# Patient Record
Sex: Female | Born: 1950 | Race: White | Hispanic: No | Marital: Single | Smoking: Never smoker
Health system: Southern US, Community
[De-identification: ages and names within clinical notes are randomized; demographics above are authoritative.]

## PROBLEM LIST (undated history)

## (undated) DIAGNOSIS — K219 Gastro-esophageal reflux disease without esophagitis: Secondary | ICD-10-CM

## (undated) DIAGNOSIS — M1990 Unspecified osteoarthritis, unspecified site: Secondary | ICD-10-CM

## (undated) DIAGNOSIS — IMO0001 Reserved for inherently not codable concepts without codable children: Secondary | ICD-10-CM

## (undated) DIAGNOSIS — R32 Unspecified urinary incontinence: Secondary | ICD-10-CM

## (undated) DIAGNOSIS — R05 Cough: Secondary | ICD-10-CM

## (undated) DIAGNOSIS — J329 Chronic sinusitis, unspecified: Secondary | ICD-10-CM

## (undated) DIAGNOSIS — E119 Type 2 diabetes mellitus without complications: Secondary | ICD-10-CM

## (undated) DIAGNOSIS — R059 Cough, unspecified: Secondary | ICD-10-CM

## (undated) DIAGNOSIS — E1140 Type 2 diabetes mellitus with diabetic neuropathy, unspecified: Secondary | ICD-10-CM

## (undated) DIAGNOSIS — M25669 Stiffness of unspecified knee, not elsewhere classified: Secondary | ICD-10-CM

## (undated) DIAGNOSIS — B019 Varicella without complication: Secondary | ICD-10-CM

## (undated) HISTORY — DX: Gastro-esophageal reflux disease without esophagitis: K21.9

## (undated) HISTORY — DX: Varicella without complication: B01.9

## (undated) HISTORY — PX: TUBAL LIGATION: SHX77

## (undated) HISTORY — PX: EYE SURGERY: SHX253

## (undated) HISTORY — DX: Unspecified urinary incontinence: R32

## (undated) HISTORY — DX: Unspecified osteoarthritis, unspecified site: M19.90

## (undated) HISTORY — DX: Chronic sinusitis, unspecified: J32.9

---

## 1973-07-23 HISTORY — PX: TUBAL LIGATION: SHX77

## 1973-07-23 HISTORY — PX: TONSILLECTOMY AND ADENOIDECTOMY: SHX28

## 1980-07-23 HISTORY — PX: OTHER SURGICAL HISTORY: SHX169

## 1999-07-06 ENCOUNTER — Encounter: Payer: Self-pay | Admitting: Obstetrics and Gynecology

## 1999-07-06 ENCOUNTER — Encounter: Admission: RE | Admit: 1999-07-06 | Discharge: 1999-07-06 | Payer: Self-pay | Admitting: Obstetrics and Gynecology

## 1999-07-24 HISTORY — PX: TOTAL ABDOMINAL HYSTERECTOMY W/ BILATERAL SALPINGOOPHORECTOMY: SHX83

## 2001-02-12 ENCOUNTER — Other Ambulatory Visit: Admission: RE | Admit: 2001-02-12 | Discharge: 2001-02-12 | Payer: Self-pay | Admitting: Obstetrics and Gynecology

## 2001-03-07 ENCOUNTER — Other Ambulatory Visit: Admission: RE | Admit: 2001-03-07 | Discharge: 2001-03-07 | Payer: Self-pay | Admitting: Obstetrics and Gynecology

## 2001-03-17 ENCOUNTER — Encounter: Payer: Self-pay | Admitting: Obstetrics and Gynecology

## 2001-03-17 ENCOUNTER — Encounter: Admission: RE | Admit: 2001-03-17 | Discharge: 2001-03-17 | Payer: Self-pay | Admitting: Obstetrics and Gynecology

## 2001-03-28 ENCOUNTER — Inpatient Hospital Stay (HOSPITAL_COMMUNITY): Admission: RE | Admit: 2001-03-28 | Discharge: 2001-03-31 | Payer: Self-pay | Admitting: Obstetrics and Gynecology

## 2005-04-27 ENCOUNTER — Ambulatory Visit: Payer: Self-pay

## 2005-08-31 ENCOUNTER — Ambulatory Visit: Payer: Self-pay

## 2008-07-23 HISTORY — PX: CARDIAC CATHETERIZATION: SHX172

## 2008-10-25 ENCOUNTER — Emergency Department: Payer: Self-pay | Admitting: Internal Medicine

## 2009-08-15 ENCOUNTER — Emergency Department: Payer: Self-pay | Admitting: Emergency Medicine

## 2009-12-13 ENCOUNTER — Ambulatory Visit: Payer: Self-pay | Admitting: Cardiology

## 2010-07-23 HISTORY — PX: OTHER SURGICAL HISTORY: SHX169

## 2010-08-31 ENCOUNTER — Ambulatory Visit: Payer: Self-pay | Admitting: Internal Medicine

## 2010-12-20 ENCOUNTER — Ambulatory Visit: Payer: Self-pay

## 2011-06-14 ENCOUNTER — Ambulatory Visit: Payer: Self-pay

## 2013-05-26 ENCOUNTER — Ambulatory Visit: Payer: Self-pay

## 2013-06-04 ENCOUNTER — Ambulatory Visit (INDEPENDENT_AMBULATORY_CARE_PROVIDER_SITE_OTHER): Payer: Federal, State, Local not specified - PPO | Admitting: Adult Health

## 2013-06-04 ENCOUNTER — Encounter: Payer: Self-pay | Admitting: Adult Health

## 2013-06-04 VITALS — BP 126/84 | HR 87 | Ht 62.0 in | Wt 204.0 lb

## 2013-06-04 DIAGNOSIS — Z0000 Encounter for general adult medical examination without abnormal findings: Secondary | ICD-10-CM

## 2013-06-04 DIAGNOSIS — F4321 Adjustment disorder with depressed mood: Secondary | ICD-10-CM

## 2013-06-04 DIAGNOSIS — R5381 Other malaise: Secondary | ICD-10-CM

## 2013-06-04 DIAGNOSIS — IMO0001 Reserved for inherently not codable concepts without codable children: Secondary | ICD-10-CM

## 2013-06-04 DIAGNOSIS — J329 Chronic sinusitis, unspecified: Secondary | ICD-10-CM

## 2013-06-04 DIAGNOSIS — M797 Fibromyalgia: Secondary | ICD-10-CM

## 2013-06-04 DIAGNOSIS — F329 Major depressive disorder, single episode, unspecified: Secondary | ICD-10-CM

## 2013-06-04 DIAGNOSIS — R252 Cramp and spasm: Secondary | ICD-10-CM

## 2013-06-04 DIAGNOSIS — R5383 Other fatigue: Secondary | ICD-10-CM

## 2013-06-04 DIAGNOSIS — G2581 Restless legs syndrome: Secondary | ICD-10-CM | POA: Insufficient documentation

## 2013-06-04 DIAGNOSIS — Z1239 Encounter for other screening for malignant neoplasm of breast: Secondary | ICD-10-CM

## 2013-06-04 MED ORDER — PRAMIPEXOLE DIHYDROCHLORIDE 0.5 MG PO TABS: 0.5000 mg | ORAL_TABLET | Freq: Two times a day (BID) | ORAL | Status: DC

## 2013-06-04 MED ORDER — FLUTICASONE PROPIONATE 50 MCG/ACT NA SUSP: 2.0000 | Freq: Every day | NASAL | Status: DC

## 2013-06-04 NOTE — Assessment & Plan Note (Signed)
Normal physical exam. Check labs: CBC with differential, basic metabolic panel, hepatic panel, TSH, vitamin D, B12, lipid. Mammogram ordered. Patient will schedule at [facility]. She reports colonoscopy approximately 10 years ago. Reports adhesions from hysterectomy present scoping. Request medical records from previous PCP and from Dr. Mechele Collin

## 2013-06-04 NOTE — Assessment & Plan Note (Signed)
Patient has overall pain, fatigue. She is requesting referral for fibromyalgia screen. Referral to rheumatology in [place].

## 2013-06-04 NOTE — Assessment & Plan Note (Signed)
Check electrolytes including magnesium. Try yellow mustard 1 teaspoon at bedtime.

## 2013-06-04 NOTE — Assessment & Plan Note (Signed)
Patient's Denis brother died at age 63 from leukemia approximately 5 years ago. Patient reports she has had a very difficult time with his death. She is still very tearful when discussing the events.? Depression. Previous PCP recommended antidepressant. Patient does not feel that she needs an antidepressant. Continue to follow. May benefit from group counseling or some form of grief counseling to help her sort out some of her unresolved grief

## 2013-06-04 NOTE — Progress Notes (Signed)
  Subjective:    Patient ID: Alexandria Silva, female    DOB: 01/13/1951, 62 y.o.   MRN: [id number]  HPI  Patient is a 62 y/o female who presents to clinic to establish care. Previous PCP was Dr. Randa Lynn.     Review of Systems     Objective:   Physical Exam        Assessment & Plan:

## 2013-06-04 NOTE — Assessment & Plan Note (Signed)
Reports usually 2 episodes a year minimum. Episodes usually flareup in the spring and fall. She is not currently on any antihistamine or nasal spray. Start Flonase 2 sprays into each nostril daily. Recommend humidifier and saline spray for irrigation.

## 2013-06-04 NOTE — Patient Instructions (Signed)
   Thank you for choosing [facility] for your health care needs.  Please have your labs drawn at your earliest convenience.  The results will be available through MyChart for your convenience. Please remember to activate this. The activation code is located at the end of this form.  Start flonase nasal spray 2 sprays into each nostril daily  Referral to Rheumatology for Fibromyalgia evaluation. They will call you with an appointment.

## 2013-06-09 ENCOUNTER — Other Ambulatory Visit (INDEPENDENT_AMBULATORY_CARE_PROVIDER_SITE_OTHER): Payer: Federal, State, Local not specified - PPO

## 2013-06-09 DIAGNOSIS — R5381 Other malaise: Secondary | ICD-10-CM

## 2013-06-09 DIAGNOSIS — Z0000 Encounter for general adult medical examination without abnormal findings: Secondary | ICD-10-CM

## 2013-06-09 DIAGNOSIS — R5383 Other fatigue: Secondary | ICD-10-CM

## 2013-06-09 DIAGNOSIS — R252 Cramp and spasm: Secondary | ICD-10-CM

## 2013-06-09 LAB — MAGNESIUM: Magnesium: 1.9 mg/dL (ref 1.5–2.5)

## 2013-06-09 LAB — CBC WITH DIFFERENTIAL/PLATELET
Basophils Absolute: 0 10*3/uL (ref 0.0–0.1)
Eosinophils Absolute: 0 10*3/uL (ref 0.0–0.7)
Eosinophils Relative: 0.9 % (ref 0.0–5.0)
HCT: 38.2 % (ref 36.0–46.0)
Hemoglobin: 13.1 g/dL (ref 12.0–15.0)
Lymphs Abs: 1.9 10*3/uL (ref 0.7–4.0)
MCHC: 34.2 g/dL (ref 30.0–36.0)
Monocytes Absolute: 0.4 10*3/uL (ref 0.1–1.0)
Neutro Abs: 2.6 10*3/uL (ref 1.4–7.7)
Neutrophils Relative %: 52.4 % (ref 43.0–77.0)
RDW: 14.1 % (ref 11.5–14.6)

## 2013-06-09 LAB — HEPATIC FUNCTION PANEL
ALT: 35 U/L (ref 0–35)
AST: 21 U/L (ref 0–37)
Alkaline Phosphatase: 76 U/L (ref 39–117)
Bilirubin, Direct: 0.2 mg/dL (ref 0.0–0.3)
Total Bilirubin: 1 mg/dL (ref 0.3–1.2)
Total Protein: 6.7 g/dL (ref 6.0–8.3)

## 2013-06-09 LAB — LIPID PANEL: Total CHOL/HDL Ratio: 3

## 2013-06-09 LAB — BASIC METABOLIC PANEL
BUN: 14 mg/dL (ref 6–23)
Creatinine, Ser: 0.6 mg/dL (ref 0.4–1.2)
GFR: 99.35 mL/min (ref >60.00)
Sodium: 139 mEq/L (ref 135–145)

## 2013-06-09 LAB — LDL CHOLESTEROL, DIRECT: Direct LDL: 132.4 mg/dL

## 2013-06-10 ENCOUNTER — Encounter: Payer: Self-pay | Admitting: Emergency Medicine

## 2013-06-12 ENCOUNTER — Encounter: Payer: Self-pay | Admitting: *Deleted

## 2013-06-15 ENCOUNTER — Encounter: Payer: Self-pay | Admitting: Emergency Medicine

## 2013-07-23 HISTORY — PX: KNEE SURGERY: SHX244

## 2013-10-02 ENCOUNTER — Ambulatory Visit: Payer: Self-pay | Admitting: Orthopedic Surgery

## 2014-01-09 ENCOUNTER — Emergency Department: Payer: Self-pay | Admitting: Emergency Medicine

## 2014-03-25 ENCOUNTER — Other Ambulatory Visit: Payer: Self-pay | Admitting: Adult Health

## 2014-03-26 ENCOUNTER — Other Ambulatory Visit: Payer: Self-pay | Admitting: Adult Health

## 2014-03-31 ENCOUNTER — Ambulatory Visit: Payer: Self-pay | Admitting: General Practice

## 2014-05-03 ENCOUNTER — Telehealth: Payer: Self-pay

## 2014-05-03 ENCOUNTER — Other Ambulatory Visit: Payer: Self-pay | Admitting: *Deleted

## 2014-05-03 MED ORDER — PRAMIPEXOLE DIHYDROCHLORIDE 0.5 MG PO TABS: ORAL_TABLET | ORAL | Status: DC

## 2014-05-03 NOTE — Telephone Encounter (Signed)
The patient called and is hoping to get a refill of her Mirapex rx.  (pt's last ov is 06/04/13)   Pharmacy - [facility] on [address]  Pt callback [phone]

## 2014-05-03 NOTE — Telephone Encounter (Signed)
Scheduled appoint

## 2014-05-07 ENCOUNTER — Encounter: Payer: Self-pay | Admitting: General Practice

## 2014-05-23 ENCOUNTER — Encounter: Payer: Self-pay | Admitting: General Practice

## 2014-06-07 ENCOUNTER — Encounter (INDEPENDENT_AMBULATORY_CARE_PROVIDER_SITE_OTHER): Payer: Self-pay

## 2014-06-07 ENCOUNTER — Ambulatory Visit (INDEPENDENT_AMBULATORY_CARE_PROVIDER_SITE_OTHER): Payer: Federal, State, Local not specified - PPO | Admitting: Internal Medicine

## 2014-06-07 VITALS — BP 134/78 | HR 94 | Temp 99.0°F | Resp 16 | Ht 62.0 in | Wt 206.5 lb

## 2014-06-07 DIAGNOSIS — IMO0002 Reserved for concepts with insufficient information to code with codable children: Secondary | ICD-10-CM

## 2014-06-07 DIAGNOSIS — G2581 Restless legs syndrome: Secondary | ICD-10-CM

## 2014-06-07 DIAGNOSIS — E1165 Type 2 diabetes mellitus with hyperglycemia: Secondary | ICD-10-CM

## 2014-06-07 DIAGNOSIS — R7301 Impaired fasting glucose: Secondary | ICD-10-CM

## 2014-06-07 DIAGNOSIS — M25561 Pain in right knee: Secondary | ICD-10-CM

## 2014-06-07 DIAGNOSIS — E669 Obesity, unspecified: Secondary | ICD-10-CM

## 2014-06-07 DIAGNOSIS — E559 Vitamin D deficiency, unspecified: Secondary | ICD-10-CM

## 2014-06-07 DIAGNOSIS — Z23 Encounter for immunization: Secondary | ICD-10-CM

## 2014-06-07 DIAGNOSIS — Z1239 Encounter for other screening for malignant neoplasm of breast: Secondary | ICD-10-CM

## 2014-06-07 MED ORDER — PRAMIPEXOLE DIHYDROCHLORIDE 0.5 MG PO TABS: ORAL_TABLET | ORAL | Status: AC

## 2014-06-07 NOTE — Progress Notes (Signed)
Pre-visit discussion using our clinic review tool. No additional management support is needed unless otherwise documented below in the visit note.  

## 2014-06-07 NOTE — Progress Notes (Signed)
Patient ID: Alexandria Silva, female   DOB: 05/15/1951, 63 y.o.   MRN: [id number]   Patient Active Problem List   Diagnosis Date Noted  . Vitamin D deficiency 06/08/2014  . Knee pain, right 06/08/2014  . Diabetes mellitus type 2, uncontrolled 06/08/2014  . Sinusitis, chronic 06/04/2013  . Unresolved grief 06/04/2013  . Routine general medical examination at a health care facility 06/04/2013  . Restless legs syndrome 06/04/2013  . Fibromyalgia 06/04/2013    Subjective:  CC:   Chief Complaint  Patient presents with  . Follow-up    For medication refills    HPI:   Alexandria Silva is a 63 y.o. female who presents for   Past Medical History  Diagnosis Date  . Arthritis     Bilateral knees  . GERD (gastroesophageal reflux disease)   . Chicken pox   . Chronic sinus infection   . Urine incontinence     Past Surgical History  Procedure Laterality Date  . Total abdominal hysterectomy w/ bilateral salpingoophorectomy    . Tonsillectomy and adenoidectomy    . Rupture disc  1982    C4  . Tubal ligation    . 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 Cardiac catheterization  2010    Dr Cassie FreerParachos        The following portions of the patient's history were reviewed and updated as appropriate: Allergies, current medications, and problem list.    Review of Systems:   Patient denies headache, fevers, malaise, unintentional weight loss, skin rash, eye pain, sinus congestion and sinus pain, sore throat, dysphagia,  hemoptysis , cough, dyspnea, wheezing, chest pain, palpitations, orthopnea, edema, abdominal pain, nausea, melena, diarrhea, constipation, flank pain, dysuria, hematuria, urinary  Frequency, nocturia, numbness, tingling, seizures,  Focal weakness, Loss of consciousness,  Tremor, insomnia, depression, anxiety, and suicidal ideation.     History   Social History  . Marital Status: Single    Spouse Name: N/A    Number of Children: 1  . Years of Education: 14    Occupational History  . Audiological scientistostmaster     [employer]   Social History Main Topics  . Smoking status: Never Smoker   . Smokeless tobacco: Not on file  . Alcohol Use: No  . Drug Use: No  . Sexual Activity: Not on file   Other Topics Concern  . Not on file   Social History Narrative   [place] 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 She has been with the post office for 30 years. She currently lives with a friend until she is able to sell her house in [place], [place]. She had been relocated there for her job but [place] in [place]. 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    Objective:  Filed Vitals:   06/07/14 1559  BP: 134/78  Pulse: 94  Temp: 99 F (37.2 C)  Resp: 16     General appearance: alert, cooperative and appears stated age Ears: normal TM's and external ear canals both ears Throat: lips, mucosa, and tongue normal; teeth and gums normal Neck: no adenopathy, no carotid bruit, supple, symmetrical, trachea midline and thyroid not enlarged, symmetric, no tenderness/mass/nodules Back: symmetric, no curvature. ROM normal. No CVA tenderness. Lungs: clear to auscultation bilaterally Heart: regular rate and rhythm, S1, S2 normal, no murmur, click, rub or gallop  Abdomen: soft, non-tender; bowel sounds normal; no masses,  no organomegaly Pulses: 2+ and symmetric Skin: Skin color, texture, turgor normal. No rashes or lesions Lymph nodes: Cervical, supraclavicular, and axillary nodes normal.  Assessment and Plan:  Restless legs syndrome managed with mirapex.Marland Kitchen.  Refills given  Vitamin D deficiency Patient cautioned to avoid mega doses of Vit d unless her level is lw.  Discussed the current controversies surrounding the risks and benefits of calcium supplementation.  Encouraged her to increase dietary calcium through natural foods including  almond/coconut milk  Knee pain, right She had had incomplete relief of pain with arthroscopy  Diabetes mellitus type 2, uncontrolled No prior diagnosis a,though she had a fasting glucose of 148 last year.  Will return for diabetes education and medication initiation with metformin.    Updated Medication List Outpatient Encounter Prescriptions as of 06/07/2014  Medication Sig  . fish oil-omega-3 fatty acids 1000 MG capsule Take 2 g by mouth daily.  . Multiple Vitamin (MULTIVITAMIN) tablet Take 1 tablet by mouth daily.  . pramipexole (MIRAPEX) 0.5 MG tablet TAKE 1 TABLET BY MOUTH TWICE DAILY  . [DISCONTINUED] pramipexole (MIRAPEX) 0.5 MG tablet TAKE 1 TABLET BY MOUTH TWICE DAILY  . ergocalciferol (DRISDOL) 50000 UNITS capsule Take 1 capsule (50,000 Units total) by mouth once a week.  . fluticasone (FLONASE) 50 MCG/ACT nasal spray Place 2 sprays into both nostrils daily.  Marland Kitchen. glucosamine-chondroitin 500-400 MG tablet Take 1 tablet by mouth 2 (two) times daily.     Orders Placed This Encounter  Procedures  . MM DIGITAL SCREENING BILATERAL  . Tdap vaccine greater than or equal to 7yo IM  . Comprehensive metabolic panel  . Vit D  25 hydroxy (rtn osteoporosis monitoring)  . TSH  . Hemoglobin A1c    No Follow-up on file.

## 2014-06-07 NOTE — Patient Instructions (Signed)
We are checking your vitamin D, thyroid  And A1c today  You received the TDao vaccine  I have ordered your mammogram today so you may set it  [facility]

## 2014-06-08 ENCOUNTER — Encounter: Payer: Self-pay | Admitting: Internal Medicine

## 2014-06-08 DIAGNOSIS — E1165 Type 2 diabetes mellitus with hyperglycemia: Secondary | ICD-10-CM | POA: Insufficient documentation

## 2014-06-08 DIAGNOSIS — E559 Vitamin D deficiency, unspecified: Secondary | ICD-10-CM | POA: Insufficient documentation

## 2014-06-08 DIAGNOSIS — M25561 Pain in right knee: Secondary | ICD-10-CM | POA: Insufficient documentation

## 2014-06-08 DIAGNOSIS — IMO0002 Reserved for concepts with insufficient information to code with codable children: Secondary | ICD-10-CM | POA: Insufficient documentation

## 2014-06-08 LAB — COMPREHENSIVE METABOLIC PANEL
ALK PHOS: 110 U/L (ref 39–117)
ALT: 28 U/L (ref 0–35)
AST: 23 U/L (ref 0–37)
Albumin: 4.6 g/dL (ref 3.5–5.2)
BUN: 13 mg/dL (ref 6–23)
CO2: 20 mEq/L (ref 19–32)
CREATININE: 1 mg/dL (ref 0.4–1.2)
Calcium: 9.6 mg/dL (ref 8.4–10.5)
Chloride: 113 mEq/L — ABNORMAL HIGH (ref 96–112)
GFR: 62.79 mL/min (ref >60.00)
Glucose, Bld: 263 mg/dL — ABNORMAL HIGH (ref 70–99)
Potassium: 5 mEq/L (ref 3.5–5.1)
Sodium: 146 mEq/L — ABNORMAL HIGH (ref 135–145)
Total Bilirubin: 0.5 mg/dL (ref 0.2–1.2)
Total Protein: 7.6 g/dL (ref 6.0–8.3)

## 2014-06-08 LAB — TSH: TSH: 1.13 u[IU]/mL (ref 0.35–4.50)

## 2014-06-08 LAB — VITAMIN D 25 HYDROXY (VIT D DEFICIENCY, FRACTURES): VITD: 19.04 ng/mL — ABNORMAL LOW (ref 30.00–100.00)

## 2014-06-08 LAB — HEMOGLOBIN A1C: HEMOGLOBIN A1C: 8.4 % — AB (ref 4.6–6.5)

## 2014-06-08 MED ORDER — ERGOCALCIFEROL 1.25 MG (50000 UT) PO CAPS: 50000.0000 [IU] | ORAL_CAPSULE | ORAL | Status: DC

## 2014-06-08 NOTE — Addendum Note (Signed)
Addended by: Sherlene ShamsULLO, Christopherjohn Schiele L on: 06/08/2014 10:30 PM   Modules accepted: Orders

## 2014-06-08 NOTE — Assessment & Plan Note (Signed)
Patient cautioned to avoid mega doses of Vit d unless her level is lw.  Discussed the current controversies surrounding the risks and benefits of calcium supplementation.  Encouraged her to increase dietary calcium through natural foods including almond/coconut milk

## 2014-06-08 NOTE — Assessment & Plan Note (Signed)
No prior diagnosis a,though she had a fasting glucose of 148 last year.  Will return for diabetes education and medication initiation with metformin.

## 2014-06-08 NOTE — Assessment & Plan Note (Signed)
managed with mirapex.Marland Kitchen.  Refills given

## 2014-06-08 NOTE — Assessment & Plan Note (Signed)
She had had incomplete relief of pain with arthroscopy

## 2014-06-22 ENCOUNTER — Encounter: Payer: Self-pay | Admitting: General Practice

## 2014-07-23 ENCOUNTER — Encounter: Payer: Self-pay | Admitting: General Practice

## 2014-08-23 ENCOUNTER — Encounter: Payer: Self-pay | Admitting: General Practice

## 2014-11-13 NOTE — Op Note (Signed)
PATIENT NAME:  Alexandria Silva, Alexandria Silva MR#:  [id number] DATE OF BIRTH:  [birth year]  DATE OF PROCEDURE:  03/31/2014  PREOPERATIVE DIAGNOSIS: Internal derangement of the right knee.   POSTOPERATIVE DIAGNOSES:  1. Tear of the posterior horn medial meniscus, right knee.  2. Tear of the posterior horn, lateral meniscus, right knee.  3. 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   PROCEDURES PERFORMED: Right knee arthroscopy, partial medial and lateral meniscectomies, and chondroplasty of the medial and patellofemoral compartments.   SURGEON: Illene Labrador. Angie Fava., M.D.    ANESTHESIA: General.   ESTIMATED BLOOD LOSS: Minimal.   TOURNIQUET TIME: Not used.   DRAINS: None.   INDICATIONS FOR SURGERY: The patient is a 64 year old female who has been seen for complaints of persistent right knee pain. MRI demonstrated findings consistent with meniscal pathology. After discussion of the risks and benefits of surgical intervention, the patient expressed understanding of the risks and benefits and agreed with plans for surgical intervention.   PROCEDURE IN DETAIL: The patient was brought to the operating room. After adequate general anesthesia was achieved, a tourniquet was placed on the patient's right thigh and the leg was placed in a leg holder. All bony prominences were well padded. The patient's right knee and leg were cleaned and prepped with alcohol and DuraPrep draped in the usual sterile fashion. A "timeout" was performed as per usual protocol. The anticipated portal sites were injected with 0.25% Marcaine with epinephrine. An anterolateral portal was created and a cannula was inserted. The scope was inserted and the knee was distended with fluid using the pump. The scope was advanced down the medial gutter into the medial compartment of the knee. Under visualization with the scope, an anteromedial portal was created and a hooked probe was inserted. Inspection of the medial  compartment demonstrated a complex tear of the posterior horn of the medial meniscus with a flap-like component noted. The tear was debrided using meniscal punches and a 4.5 mm shaver. Final contouring was performed using the 50 degree ArthroCare wand. The posterior horn was again evaluated and felt to be stable. Some fraying was noted to the anterior horn and this area was debrided and contoured using the 50 degree ArthroCare wand. Inspection of the articular cartilage demonstrated some grade 3 changes of chondromalacia. The area was debrided and contoured using the 50 degree ArthroCare wand, taking care to provide good transition between the involved cartilage and more normal-appearing cartilage. The scope was then advanced into the intracondylar region. The anterior cruciate ligament was visualized and probed and felt to be stable.   The scope was removed from the anterolateral portal and reinserted via the anteromedial portal so as to better visualize the lateral compartment. The articular surface was in good condition. There was a tear involving the posterior horn of the lateral meniscus, and this was debrided using the 4.5 mm shaver with final contouring performed using the 50 degree ArthroCare wand. The remaining portion of the meniscus was visualized and probed, and felt to be stable. Finally, the scope was positioned so as to visualize the patellofemoral articulation. Good patellar tracking was noted. Grade 3 changes of chondromalacia were noted to the articular surface of the patella. These areas were debrided and contoured using the 50 degree ArthroCare wand.   The knee was irrigated with copious amounts of fluid and then suctioned dry. The anterolateral portal was reapproximated using 3-0 nylon. A combination of 0.25% Marcaine with epinephrine and 4 mg of morphine was  injected via the scope. The scope was removed and the anteromedial portal was reapproximated using 3-0 nylon. Sterile dressing was  applied followed by application of an ice wrap.   The patient tolerated the procedure well. She was transported to the recovery room in stable condition.    ____________________________ Illene LabradorJames P. Angie FavaHooten Jr., MD jph:JT D: 04/01/2014 06:03:46 ET T: 04/01/2014 08:16:46 ET JOB#: [id number]  cc: Illene LabradorJames P. Angie FavaHooten Jr., MD, <Dictator> JAMES P Angie FavaHOOTEN JR MD ELECTRONICALLY SIGNED 04/01/2014 20:41

## 2014-11-16 ENCOUNTER — Ambulatory Visit: Payer: Federal, State, Local not specified - PPO | Admitting: Nurse Practitioner

## 2014-11-23 ENCOUNTER — Ambulatory Visit: Payer: Federal, State, Local not specified - PPO | Admitting: Nurse Practitioner

## 2015-01-12 ENCOUNTER — Other Ambulatory Visit: Payer: Self-pay | Admitting: Physician Assistant

## 2015-01-12 DIAGNOSIS — Z1231 Encounter for screening mammogram for malignant neoplasm of breast: Secondary | ICD-10-CM

## 2015-01-20 ENCOUNTER — Ambulatory Visit: Payer: Federal, State, Local not specified - PPO

## 2015-01-25 ENCOUNTER — Ambulatory Visit: Payer: Federal, State, Local not specified - PPO | Attending: Physician Assistant

## 2015-02-01 NOTE — Discharge Instructions (Signed)
[facility] °[facility] °ENDOSCOPIC SINUS SURGERY ° [facility] ° °What is Functional Endoscopic Sinus Surgery? ° The Surgery involves making the natural openings of the sinuses larger by removing the bony partitions that separate the sinuses from the nasal cavity.  The natural sinus lining is preserved as much as possible to allow the sinuses to resume normal function after the surgery.  In some patients nasal polyps (excessively swollen lining of the sinuses) may be removed to relieve obstruction of the sinus openings.  The surgery is performed through the nose using lighted scopes, which eliminates the need for incisions on the face.  A septoplasty is a different procedure which is sometimes performed with sinus surgery.  It involves straightening the boy partition that separates the two sides of your nose.  A crooked or deviated septum may need repair if is obstructing the sinuses or nasal airflow.  Turbinate reduction is also often performed during sinus surgery.  The turbinates are bony proturberances from the side walls of the nose which swell and can obstruct the nose in patients with sinus and allergy problems.  Their size can be surgically reduced to help relieve nasal obstruction. ° °What Can Sinus Surgery Do For Me? ° Sinus surgery can reduce the frequency of sinus infections requiring antibiotic treatment.  This can provide improvement in nasal congestion, post-nasal drainage, facial pressure and nasal obstruction.  Surgery will NOT prevent you from ever having an infection again, so it usually only for patients who get infections 4 or more times yearly requiring antibiotics, or for infections that do not clear with antibiotics.  It will not cure nasal allergies, so patients with allergies may still require medication to treat their allergies after surgery. Surgery may improve headaches related to sinusitis, however, some people will continue to  require medication to control sinus headaches related to allergies.  Surgery will do nothing for other forms of headache (migraine, tension or cluster). °What Are the Risks of Endoscopic Sinus Surgery? ° Current techniques allow surgery to be performed safely with little risk, however, there are rare complications that patients should be aware of.  Because the sinuses are located around the eyes, there is risk of eye injury, including blindness, though again, this would be quite rare. This is usually a result of bleeding behind the eye during surgery, which puts the vision oat risk, though there are treatments to protect the vision and prevent permanent disrupted by surgery causing a leak of the spinal fluid that surrounds the brain.  More serious complications would include bleeding inside the brain cavity or damage to the brain.  Again, all of these complications are uncommon, and spinal fluid leaks can be safely managed surgically if they occur.  The most common complication of sinus surgery is bleeding from the nose, which may require packing or cauterization of the nose.  Continued sinus have polyps may experience recurrence of the polyps requiring revision surgery.  Alterations of sense of smell or injury to the tear ducts are also rare complications.  °What is the Surgery Like, and what is the Recovery? ° The Surgery usually takes a couple of hours to perform, and is usually performed under a general anesthetic (completely asleep).  Patients are usually discharged home after a couple of hours.  Sometimes during surgery it is necessary to pack the nose to control bleeding, and the packing is left in place for 24 - 48 hours, and removed by your surgeon.  If   a septoplasty was performed during the procedure, there is often a splint placed which must be removed after 5-7 days.   °Discomfort: Pain is usually mild to moderate, and can be controlled by prescription pain medication or acetaminophen (Tylenol).   Aspirin, Ibuprofen (Advil, Motrin), or Naprosyn (Aleve) should be avoided, as they can cause increased bleeding.  Most patients feel sinus pressure like they have a bad head cold for several days.  Sleeping with your head elevated can help reduce swelling and facial pressure, as can ice packs over the face.  A humidifier may be helpful to keep the mucous and blood from drying in the nose.  °Diet: There are no specific diet restrictions, however, you should generally start with clear liquids and a light diet of bland foods because the anesthetic can cause some nausea.  Advance your diet depending on how your stomach feels.  Taking your pain medication with food will often help reduce stomach upset which pain medications can cause. ° °Nasal Saline Irrigation: It is important to remove blood clots and dried mucous from the nose as it is healing.  This is done by having you irrigate the nose at least 3 - 4 times daily with a salt water solution.  The salt water solution is made as follows:  1) 2 - 3 heaping teaspoons of canning, pickling or sea salt °  2) 1 teaspoon baking soda, such as Arm [employer] Hammer °  3) 1 quart of warm distilled water °The nose is irrigated using an ear syringe (available at the drug store).  Fill the bulb with the solution, bend over a sink, and insert the syringe into the nose ½ to ¾ of an inch.  Point the tip of the syringe towards the inside corner of the eye on the same side your irrigating.  Squeeze the syringe and gently irrigate the nose.  If you bend forward as you do this, most of the fluid will flow back out of the nose, instead of down your throat.  Make a new solution every 2 - 3 days.  The solution should be ward, near body temperature, when you irrigate. ° °Note that if you are instructed to use Nasal Steroid Sprays at any time after your surgery, irrigate with saline BEFORE using the steroid spray, so you do not wash it all out of the nose. °Another product, Nasal Saline Gel (such as  AYR Nasal Saline Gel) can be applied in each nostril 3 - 4 times daily to moisture the nose and reduce scabbing or crusting. ° °Bleeding:  Bloody drainage from the nose can be expected for several days, and patients are instructed to irrigate their nose frequently with salt water to help remove mucous and blood clots.  The drainage may be dark red or brown, though some fresh blood may be seen intermittently, especially after irrigation.  Do not blow you nose, as bleeding may occur. If you must sneeze, keep your mouth open to allow air to escape through your mouth. °If heavy bleeding occurs: Irrigate the nose with saline to rinse out clots, then spray the nose 3 - 4 times with Afrin Nasal Decongestant Spray.  The spray will constrict the blood vessels to slow bleeding.  Pinch the lower half of your nose shut to apply pressure, and lay down with your head elevated.  Ice packs over the nose may help as well. If bleeding persists despite these measures, you should notify your doctor.  Do not use the Afrin routinely   to control nasal congestion after surgery, as it can result in worsening congestion and may affect healing.  ° °Activity: Return to work varies among patients. Most patients will be out of work at least 5 - 7 days to recover.  Patient may return to work after they are off of narcotic pain medication, and feeling well enough to perform the functions of their job.  Patients must avoid heavy lifting (over 10 pounds) or strenuous physical for 2 weeks after surgery, so your employer may need to assign you to light duty, or keep you out of work longer if light duty is not possible.  NOTE: you should not drive, operate dangerous machinery, do any mentally demanding tasks or make any important legal or financial decisions while on narcotic pain medication and recovering from the general anesthetic.  °  °Call Your Doctor Immediately if You Have Any of the Following: °1. Bleeding that you cannot control with the above  measures °2. Loss of vision, double vision, bulging of the eye or black eyes. °3. Fever over 101 degrees °4. Neck stiffness with severe headache, fever, nausea and change in mental state. °You are always encourage to call anytime with concerns, however, please call with requests for pain medication refills during office hours. ° °Office Endoscopy: During follow-up visits your doctor will remove any packing or splints that may have been placed and evaluate and clean your sinuses endoscopically.  Topical anesthetic will be used to make this as comfortable as possible, though you may want to take your pain medication prior to the visit.  How often this will need to be done varies from patient to patient.  After complete recovery from the surgery, you may need follow-up endoscopy from time to time, particularly if there is concern of recurrent infection or nasal polyps. ° ° °General Anesthesia, Care After °Refer to this sheet in the next few weeks. These instructions provide you with information on caring for yourself after your procedure. Your health care provider may also give you more specific instructions. Your treatment has been planned according to current medical practices, but problems sometimes occur. Call your health care provider if you have any problems or questions after your procedure. °WHAT TO EXPECT AFTER THE PROCEDURE °After the procedure, it is typical to experience: °· Sleepiness. °· Nausea and vomiting. °HOME CARE INSTRUCTIONS °· For the first 24 hours after general anesthesia: °¨ Have a responsible person with you. °¨ Do not drive a car. If you are alone, do not take public transportation. °¨ Do not drink alcohol. °¨ Do not take medicine that has not been prescribed by your health care provider. °¨ Do not sign important papers or make important decisions. °¨ You may resume a normal diet and activities as directed by your health care provider. °· Change bandages (dressings) as directed. °· If you  have questions or problems that seem related to general anesthesia, call the hospital and ask for the anesthetist or anesthesiologist on call. °SEEK MEDICAL CARE IF: °· You have nausea and vomiting that continue the day after anesthesia. °· You develop a rash. °SEEK IMMEDIATE MEDICAL CARE IF:  °· You have difficulty breathing. °· You have chest pain. °· You have any allergic problems. °Document Released: 10/15/2000 Document Revised: 07/14/2013 Document Reviewed: 01/22/2013 °[employer]® Patient Information ©2015 [employer]. This information is not intended to replace advice given to you by your health care provider. Make sure you discuss any questions you have with your health care provider. ° °

## 2015-02-03 ENCOUNTER — Encounter: Payer: Self-pay | Admitting: *Deleted

## 2015-02-03 ENCOUNTER — Encounter
Admission: RE | Disposition: A | Discharge status: Home / Self Care | Payer: Self-pay | Source: Ambulatory Visit | Attending: Otolaryngology

## 2015-02-03 ENCOUNTER — Ambulatory Visit
Payer: Federal, State, Local not specified - PPO | Admitting: Student in an Organized Health Care Education/Training Program

## 2015-02-03 ENCOUNTER — Ambulatory Visit
Admission: RE | Admit: 2015-02-03 | Discharge: 2015-02-03 | Disposition: A | Discharge status: Home / Self Care | Payer: Federal, State, Local not specified - PPO | Source: Ambulatory Visit | Attending: Otolaryngology | Admitting: Otolaryngology

## 2015-02-03 DIAGNOSIS — Z8489 Family history of other specified conditions: Secondary | ICD-10-CM | POA: Diagnosis not present

## 2015-02-03 DIAGNOSIS — Z809 Family history of malignant neoplasm, unspecified: Secondary | ICD-10-CM | POA: Diagnosis not present

## 2015-02-03 DIAGNOSIS — J323 Chronic sphenoidal sinusitis: Secondary | ICD-10-CM | POA: Diagnosis not present

## 2015-02-03 DIAGNOSIS — Z8261 Family history of arthritis: Secondary | ICD-10-CM | POA: Diagnosis not present

## 2015-02-03 DIAGNOSIS — Z823 Family history of stroke: Secondary | ICD-10-CM | POA: Insufficient documentation

## 2015-02-03 DIAGNOSIS — Z8249 Family history of ischemic heart disease and other diseases of the circulatory system: Secondary | ICD-10-CM | POA: Diagnosis not present

## 2015-02-03 DIAGNOSIS — Z79899 Other long term (current) drug therapy: Secondary | ICD-10-CM | POA: Diagnosis not present

## 2015-02-03 DIAGNOSIS — B49 Unspecified mycosis: Secondary | ICD-10-CM | POA: Insufficient documentation

## 2015-02-03 DIAGNOSIS — Z83511 Family history of glaucoma: Secondary | ICD-10-CM | POA: Diagnosis not present

## 2015-02-03 DIAGNOSIS — Z832 Family history of diseases of the blood and blood-forming organs and certain disorders involving the immune mechanism: Secondary | ICD-10-CM | POA: Diagnosis not present

## 2015-02-03 DIAGNOSIS — J988 Other specified respiratory disorders: Secondary | ICD-10-CM | POA: Insufficient documentation

## 2015-02-03 DIAGNOSIS — J342 Deviated nasal septum: Secondary | ICD-10-CM | POA: Diagnosis not present

## 2015-02-03 DIAGNOSIS — J320 Chronic maxillary sinusitis: Secondary | ICD-10-CM | POA: Diagnosis not present

## 2015-02-03 HISTORY — DX: Cough: R05

## 2015-02-03 HISTORY — PX: NASAL SEPTOPLASTY W/ TURBINOPLASTY: SHX2070

## 2015-02-03 HISTORY — DX: Cough, unspecified: R05.9

## 2015-02-03 HISTORY — DX: Type 2 diabetes mellitus without complications: E11.9

## 2015-02-03 HISTORY — PX: IMAGE GUIDED SINUS SURGERY: SHX6570

## 2015-02-03 HISTORY — PX: ENDOSCOPIC CONCHA BULLOSA RESECTION: SHX6395

## 2015-02-03 HISTORY — PX: MAXILLARY ANTROSTOMY: SHX2003

## 2015-02-03 HISTORY — DX: Reserved for inherently not codable concepts without codable children: IMO0001

## 2015-02-03 HISTORY — PX: SPHENOIDECTOMY: SHX2421

## 2015-02-03 HISTORY — DX: Type 2 diabetes mellitus with diabetic neuropathy, unspecified: E11.40

## 2015-02-03 HISTORY — DX: Stiffness of unspecified knee, not elsewhere classified: M25.669

## 2015-02-03 LAB — GLUCOSE, CAPILLARY
Glucose-Capillary: 125 mg/dL — ABNORMAL HIGH (ref 65–99)
Glucose-Capillary: 157 mg/dL — ABNORMAL HIGH (ref 65–99)

## 2015-02-03 SURGERY — SEPTOPLASTY, NOSE, WITH NASAL TURBINATE REDUCTION
Anesthesia: General | Laterality: Left | Wound class: Clean Contaminated

## 2015-02-03 MED ORDER — MIDAZOLAM HCL 5 MG/5ML IJ SOLN
INTRAMUSCULAR | Status: DC | PRN
  Administered 2015-02-03: 2 mg via INTRAVENOUS

## 2015-02-03 MED ORDER — PROPOFOL 10 MG/ML IV BOLUS
INTRAVENOUS | Status: DC | PRN
  Administered 2015-02-03: 150 mg via INTRAVENOUS

## 2015-02-03 MED ORDER — OXYCODONE HCL 5 MG PO TABS: 5.0000 mg | ORAL_TABLET | Freq: Once | ORAL | Status: AC | PRN

## 2015-02-03 MED ORDER — PHENYLEPHRINE HCL 0.5 % NA SOLN
NASAL | Status: DC | PRN
  Administered 2015-02-03: 30 mL via TOPICAL

## 2015-02-03 MED ORDER — FENTANYL CITRATE (PF) 100 MCG/2ML IJ SOLN: 25.0000 ug | INTRAMUSCULAR | Status: DC | PRN

## 2015-02-03 MED ORDER — OXYMETAZOLINE HCL 0.05 % NA SOLN
2.0000 | Freq: Once | NASAL | Status: AC
  Administered 2015-02-03: 2 via NASAL

## 2015-02-03 MED ORDER — SUCCINYLCHOLINE CHLORIDE 20 MG/ML IJ SOLN
INTRAMUSCULAR | Status: DC | PRN
  Administered 2015-02-03: 80 mg via INTRAVENOUS

## 2015-02-03 MED ORDER — DEXAMETHASONE SODIUM PHOSPHATE 4 MG/ML IJ SOLN
INTRAMUSCULAR | Status: DC | PRN
  Administered 2015-02-03: 10 mg via INTRAVENOUS

## 2015-02-03 MED ORDER — LACTATED RINGERS IV SOLN
INTRAVENOUS | Status: DC
  Administered 2015-02-03: 09:00:00 via INTRAVENOUS

## 2015-02-03 MED ORDER — ROCURONIUM BROMIDE 100 MG/10ML IV SOLN
INTRAVENOUS | Status: DC | PRN
  Administered 2015-02-03: 10 mg via INTRAVENOUS
  Administered 2015-02-03: 20 mg via INTRAVENOUS

## 2015-02-03 MED ORDER — LIDOCAINE-EPINEPHRINE 1 %-1:100000 IJ SOLN
INTRAMUSCULAR | Status: DC | PRN
  Administered 2015-02-03: 12 mL

## 2015-02-03 MED ORDER — CEFAZOLIN SODIUM 1-5 GM-% IV SOLN
1.0000 g | Freq: Once | INTRAVENOUS | Status: AC
  Administered 2015-02-03: 1 g via INTRAVENOUS

## 2015-02-03 MED ORDER — ONDANSETRON HCL 4 MG/2ML IJ SOLN: 4.0000 mg | Freq: Once | INTRAMUSCULAR | Status: DC | PRN

## 2015-02-03 MED ORDER — SCOPOLAMINE 1 MG/3DAYS TD PT72
1.0000 | MEDICATED_PATCH | Freq: Once | TRANSDERMAL | Status: DC
  Administered 2015-02-03: 1.5 mg via TRANSDERMAL

## 2015-02-03 MED ORDER — OXYCODONE HCL 5 MG/5ML PO SOLN
5.0000 mg | Freq: Once | ORAL | Status: AC | PRN
  Administered 2015-02-03: 5 mg via ORAL

## 2015-02-03 MED ORDER — LIDOCAINE HCL (CARDIAC) 20 MG/ML IV SOLN
INTRAVENOUS | Status: DC | PRN
  Administered 2015-02-03: 40 mg via INTRAVENOUS

## 2015-02-03 MED ORDER — ONDANSETRON HCL 4 MG/2ML IJ SOLN
INTRAMUSCULAR | Status: DC | PRN
Start: 2015-02-03 — End: 2015-02-03
  Administered 2015-02-03: 4 mg via INTRAVENOUS

## 2015-02-03 MED ORDER — FENTANYL CITRATE (PF) 100 MCG/2ML IJ SOLN
INTRAMUSCULAR | Status: DC | PRN
  Administered 2015-02-03: 50 ug via INTRAVENOUS

## 2015-02-03 MED ORDER — ACETAMINOPHEN 10 MG/ML IV SOLN
1000.0000 mg | Freq: Once | INTRAVENOUS | Status: AC
Start: 2015-02-03 — End: 2015-02-03
  Administered 2015-02-03: 1000 mg via INTRAVENOUS

## 2015-02-03 SURGICAL SUPPLY — 47 items
BALLOON SINUPLASTY SYSTEM (BALLOONS) IMPLANT
BATTERY INSTRU NAVIGATION (MISCELLANEOUS) ×16 IMPLANT
BLADE SURG 15 STRL LF DISP TIS (BLADE) IMPLANT
BLADE SURG 15 STRL SS (BLADE)
BTRY SRG DRVR LF (MISCELLANEOUS) ×8
CANISTER SUCT 1200ML W/VALVE (MISCELLANEOUS) ×4 IMPLANT
CATH IV 18X1 1/4 SAFELET (CATHETERS) ×4 IMPLANT
COAG SUCT 10F 3.5MM HAND CTRL (MISCELLANEOUS) ×4 IMPLANT
COAGULATOR SUCT 8FR VV (MISCELLANEOUS) IMPLANT
DEVICE INFLATION SEID (MISCELLANEOUS) IMPLANT
DRAPE HEAD BAR (DRAPES) ×4 IMPLANT
DRESSING NASL FOAM PST OP SINU (MISCELLANEOUS) IMPLANT
DRSG NASAL 4CM NASOPORE (MISCELLANEOUS) IMPLANT
DRSG NASAL FOAM POST OP SINU (MISCELLANEOUS)
GLOVE PI ULTRA LF STRL 7.5 (GLOVE) ×4 IMPLANT
GLOVE PI ULTRA NON LATEX 7.5 (GLOVE) ×4
IRRIGATOR 4MM STR (IRRIGATION / IRRIGATOR) ×4 IMPLANT
IV CATH 18X1 1/4 SAFELET (CATHETERS) ×2
IV NS 500ML (IV SOLUTION) ×4
IV NS 500ML BAXH (IV SOLUTION) ×2 IMPLANT
NAVIGATION MASK REG  ST (MISCELLANEOUS) ×4 IMPLANT
NDL HYPO 25GX1X1/2 BEV (NEEDLE) ×2 IMPLANT
NDL SPNL 25GX3.5 QUINCKE BL (NEEDLE) IMPLANT
NEEDLE HYPO 25GX1X1/2 BEV (NEEDLE) ×4 IMPLANT
NEEDLE SPNL 25GX3.5 QUINCKE BL (NEEDLE) IMPLANT
NS IRRIG 500ML POUR BTL (IV SOLUTION) ×4 IMPLANT
PACK DRAPE NASAL/ENT (PACKS) ×4 IMPLANT
PACKING NASAL EPIS 4X2.4 XEROG (MISCELLANEOUS) ×2 IMPLANT
PAD GROUND ADULT SPLIT (MISCELLANEOUS) ×4 IMPLANT
PATTIES SURGICAL .5 X3 (DISPOSABLE) ×4 IMPLANT
SET HANDPIECE IRR DIEGO (MISCELLANEOUS) ×4 IMPLANT
SINUPLASTY BALLN CATHTIP (CATHETERS) IMPLANT
SOL ANTI-FOG 6CC FOG-OUT (MISCELLANEOUS) ×2 IMPLANT
SOL FOG-OUT ANTI-FOG 6CC (MISCELLANEOUS) ×2
SPLINT NASAL SEPTAL BLV .50 ST (MISCELLANEOUS) ×4 IMPLANT
STRAP BODY AND KNEE 60X3 (MISCELLANEOUS) ×4 IMPLANT
SUT CHROMIC 3-0 (SUTURE) ×4
SUT CHROMIC 3-0 KS 27XMFL CR (SUTURE) ×2
SUT ETHILON 3-0 KS 30 BLK (SUTURE) ×4 IMPLANT
SUT ETHILON 4-0 (SUTURE)
SUT ETHILON 4-0 FS2 18XMFL BLK (SUTURE)
SUT PLAIN GUT 4-0 (SUTURE) ×4 IMPLANT
SUTURE CHRMC 3-0 KS 27XMFL CR (SUTURE) ×2 IMPLANT
SUTURE ETHLN 4-0 FS2 18XMF BLK (SUTURE) IMPLANT
SYR 3ML LL SCALE MARK (SYRINGE) ×4 IMPLANT
TOWEL OR 17X26 4PK STRL BLUE (TOWEL DISPOSABLE) ×4 IMPLANT
WATER STERILE IRR 500ML POUR (IV SOLUTION) ×4 IMPLANT

## 2015-02-03 NOTE — Anesthesia Procedure Notes (Signed)
Procedure Name: Intubation Date/Time: 02/03/2015 9:26 AM Performed by: Andee PolesBUSH, Shivam Mestas Pre-anesthesia Checklist: Patient identified, Emergency Drugs available, Suction available, Patient being monitored and Timeout performed Patient Re-evaluated:Patient Re-evaluated prior to inductionOxygen Delivery Method: Circle system utilized Preoxygenation: Pre-oxygenation with 100% oxygen Intubation Type: IV induction Ventilation: Mask ventilation without difficulty Laryngoscope Size: Mac and 3 Grade View: Grade I Tube type: Oral Rae Tube size: 7.0 mm Number of attempts: 1 Placement Confirmation: ETT inserted through vocal cords under direct vision,  positive ETCO2 and breath sounds checked- equal and bilateral Tube secured with: Tape Dental Injury: Teeth and Oropharynx as per pre-operative assessment

## 2015-02-03 NOTE — Anesthesia Postprocedure Evaluation (Signed)
  Anesthesia Post-op Note  Patient: Alexandria Silva  Procedure(s) Performed: Procedure(s) with comments: NASAL SEPTOPLASTY WITH TURBINATE REDUCTION MIDDLE (Bilateral) - GAVE DISK TO CE CE IMAGE GUIDED SINUS SURGERY (Bilateral) SPHENOIDECTOMY (Left) MAXILLARY ANTROSTOMY (Bilateral)  CONCHA BULLOSA RESECTION (Bilateral)  Anesthesia type:General ETT  Patient location: PACU  Post pain: Pain level controlled  Post assessment: Post-op Vital signs reviewed, Patient's Cardiovascular Status Stable, Respiratory Function Stable, Patent Airway and No signs of Nausea or vomiting  Post vital signs: Reviewed and stable  Last Vitals:  Filed Vitals:   02/03/15 1300  BP: 120/77  Pulse: 65  Temp:   Resp: 16    Level of consciousness: awake, alert  and patient cooperative  Complications: No apparent anesthesia complications

## 2015-02-03 NOTE — Anesthesia Preprocedure Evaluation (Signed)
Anesthesia Evaluation  Patient identified by MRN, date of birth, ID band  Reviewed: Allergy & Precautions, H&P , NPO status , Patient's Chart, lab work & pertinent test results  Airway Mallampati: I  TM Distance: >3 FB Neck ROM: full    Dental no notable dental hx.    Pulmonary shortness of breath and with exertion,    Pulmonary exam normal       Cardiovascular Rhythm:regular Rate:Normal     Neuro/Psych    GI/Hepatic GERD-  ,  Endo/Other  diabetes, Type 2, Oral Hypoglycemic Agents  Renal/GU      Musculoskeletal   Abdominal   Peds  Hematology   Anesthesia Other Findings   Reproductive/Obstetrics                             Anesthesia Physical Anesthesia Plan  ASA: II  Anesthesia Plan: General ETT   Post-op Pain Management:    Induction:   Airway Management Planned:   Additional Equipment:   Intra-op Plan:   Post-operative Plan:   Informed Consent: I have reviewed the patients History and Physical, chart, labs and discussed the procedure including the risks, benefits and alternatives for the proposed anesthesia with the patient or authorized representative who has indicated his/her understanding and acceptance.     Plan Discussed with: CRNA  Anesthesia Plan Comments:         Anesthesia Quick Evaluation

## 2015-02-03 NOTE — Op Note (Signed)
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 Very difficult to remove.  Procedure: The patient was brought to the operating room and placed in the supine position. General anesthesia was given by oral endotracheal intubation. The nose prepped using 7 mL of 1% Xylocaine with epi 1-100,000 for infiltration of the nasal septum and lateral nasal walls. Cottonoid pledgets soaked in phenylephrine and Xylocaine were placed on both sides of the nose. The septum was markedly deviated to the left side and the airway was narrower there.  Image guided system was brought in and the CT scan was downloaded from the disc. The template was applied to the face and downloaded to the system also. The suction instruments were downloaded to the system. There was 0.8 mm of variance. The suction instruments rematched up to the image guided system well.  A left hemitransfixion incision was created with elevation of mucoperichondrium on the left side of the quadrangular plate. The caudal tip of the septal cartilage was bowed to the left side and had to be freed from the anterior nasal spine. The mucoperiosteum was elevated on the right side of the regular  cartilage as well and a morselizer was used to break the spring of the cartilage. The bony cartilaginous junction was split and some the crooked vomer and ethmoid plate were removed. The vomer had a large spur on the left side blocking the posterior airway and this was removed. Maxillary crest was relatively straight. The mucosal flaps were placed back into their anatomic position and this opened up the airway on the left side much more. The flaps were sewn in position with a 40 plain gut suture in a through and through whip stitch fashion. The anterior inferior septal cartilage was anchored with 3-0 chromic through and through sutures to the midline anterior nasal spine. A portion of the inferior quadrangular plate had to be trimmed to allow it to fall back in the midline over the maxillary crest.  A 0 scope was used to visualize left side. The airway is now more open and the image guided system was used to evaluate the landmarks. Is a large conchal bullosa of the middle turbinate and this was trimmed using the Gruenwald forceps. The lateral wall of the conchal bullosa was removed to open up the middle meatus. The uncinate process was then incised using the side biter and this was removed with 45 through biting forceps. The maxillary antrum was widened using the [facility] micro[facility] and backbiting forceps. There is a small opening into the ethmoid bulla and this was widened somewhat to prevent scarring here.  The middle turbinate was infractured slightly to visualize the natural opening to the sphenoid sinus I found this just medial and to the bottom side of the superior turbinate. Sphenoid punch was used to widen  the opening to be able to visualize this area much better is made about 5 mm wide and a centimeter in height. As the sphenoid sinus was opened very thickened mucous membranes and I did not see an obvious of fungal ball at first. Once I removed some of the thickened mucous membranes to visualize this  more, there is a small pocket inferiorly posteriorly where a large fungus ball was sitting. I did a hard time getting to this area. I tried using curved suctions and could not grasp it enough to get it out. I used flush to remove some chunks. I was eventually able to slide the fungus ball up into the upper part of the sphenoid sinus were I could grab it and remove it. It came out in chunks but I was able to get it all gone. The sinus now appeared to be clear. A cottonoid pledget soaked in phenylephrine and Xylocaine was placed here for vasoconstriction.  The 0 scope was used to visualize the right the airway. The middle turbinate was enlarged and had a conchal bullosa as well plus a paradoxical curve at its lateral posterior border. The turbinate was trimmed using the Gruenwald forceps and Diego microdebrider. The lateral wall of the conchal bullosa was removed. After cautery was used along the middle turbinate remnant help control bleeding. A side biter was used to incise the uncinate process and then the uncinate was removed using the 45 through biting forceps. The natural ostium was widened to open up the maxillary antrum. 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  Both sides were visualized to make sure the tissues were cleaned. There is no significant bleeding. Xerogel was placed along the middle turbinate on the left side and near the opening to the maxillary antrum. A seconds piece of xerogel was placed into the opening of the sphenoid sinus. The right side was visualized any piece of xerogel was placed along the middle turbinate remnant again. Xomed 0.5 mm regular sized splints were then placed on both sides the septum and held in place with a 3-0 nylon through and through suture.  The patient tolerated the procedure well and was taken to the recovery room in satisfactory condition. Were no operative  complications.  Dispo:   To PACU to be discharged home  Plan:  To rest at home with head elevated. She will start saline flushes tomorrow. Follow-up in 6 days for splint removal. Begin prednisone taper tomorrow. Call if problems.  Teryl Mcconaghy H  02/03/2015 11:43 AM

## 2015-02-03 NOTE — Transfer of Care (Signed)
Immediate Anesthesia Transfer of Care Note  Patient: Alexandria Silva  Procedure(s) Performed: Procedure(s) with comments: NASAL SEPTOPLASTY WITH TURBINATE REDUCTION MIDDLE (Bilateral) - GAVE DISK TO CE CE IMAGE GUIDED SINUS SURGERY (Bilateral) SPHENOIDECTOMY (Left) MAXILLARY ANTROSTOMY (Bilateral)  CONCHA BULLOSA RESECTION (Bilateral)  Patient Location: PACU  Anesthesia Type: General ETT  Level of Consciousness: awake, alert  and patient cooperative  Airway and Oxygen Therapy: Patient Spontanous Breathing and Patient connected to supplemental oxygen  Post-op Assessment: Post-op Vital signs reviewed, Patient's Cardiovascular Status Stable, Respiratory Function Stable, Patent Airway and No signs of Nausea or vomiting  Post-op Vital Signs: Reviewed and stable  Complications: No apparent anesthesia complications

## 2015-02-03 NOTE — H&P (Signed)
  H&P has been reviewed and no changes necessary. To be downloaded later. 

## 2015-02-04 ENCOUNTER — Encounter: Payer: Self-pay | Admitting: Otolaryngology

## 2015-02-07 LAB — SURGICAL PATHOLOGY

## 2015-03-08 ENCOUNTER — Ambulatory Visit: Payer: Federal, State, Local not specified - PPO | Admitting: *Deleted

## 2015-03-22 ENCOUNTER — Ambulatory Visit: Payer: Federal, State, Local not specified - PPO | Admitting: *Deleted

## 2015-04-07 ENCOUNTER — Encounter: Payer: Self-pay | Admitting: *Deleted

## 2015-04-07 ENCOUNTER — Encounter: Payer: Federal, State, Local not specified - PPO | Attending: Internal Medicine | Admitting: *Deleted

## 2015-04-07 VITALS — BP 134/68 | Ht 64.0 in | Wt 194.6 lb

## 2015-04-07 DIAGNOSIS — E119 Type 2 diabetes mellitus without complications: Secondary | ICD-10-CM | POA: Insufficient documentation

## 2015-04-07 NOTE — Progress Notes (Signed)
Diabetes Self-Management Education  Visit Type: First/Initial  Appt. Start Time: 1325 Appt. End Time: 1430  04/07/2015  [name], identified by name and date of birth, is a 64 y.o. female with a diagnosis of Diabetes: Type 2.   ASSESSMENT  Blood pressure 134/68, height 5\' 4"  (1.626 m), weight 194 lb 9.6 oz (88.27 kg). Body mass index is 33.39 kg/(m^2).      Diabetes Self-Management Education - 04/07/15 1534    Visit Information   Visit Type First/Initial   Initial Visit   Diabetes Type Type 2   Are you currently following a meal plan? No   Are you taking your medications as prescribed? No  Pt reports Metformin causes diarrhea and she sometimes takes only 1 x day. She reports MD is aware.   Date Diagnosed June 2016 per patient; but lab results from 05/2014 show A1C of 8.4 % and 10/2014 show 8.5 %   Health Coping   How would you rate your overall health? Good   Psychosocial Assessment   Patient Belief/Attitude about Diabetes Defeat/Burnout  Pt reports "exhausted" and "fatigue with no energy" and "cloudy thinking:   Self-care barriers None   Self-management support Friends;Doctor's office   Patient Concerns Nutrition/Meal planning;Medication;Monitoring;Weight Control;Glycemic Control;Healthy Lifestyle   Special Needs None   Preferred Learning Style Auditory;Visual;Hands on   Learning Readiness Ready   How often do you need to have someone help you when you read instructions, pamphlets, or other written materials from your doctor or pharmacy? 1 - Never   What is the last grade level you completed in school? College   Complications   Last HgB A1C per patient/outside source 7.6 %  01/11/15   How often do you check your blood sugar? 1-2 times/day   Fasting Blood glucose range (mg/dL) 16-109;604-540  FBG's 122-165 mg/dL   Have you had a dilated eye exam in the past 12 months? No   Have you had a dental exam in the past 12 months? Yes   Are you checking your feet? Yes   How many days per week are you checking your feet? 3   Dietary Intake   Breakfast eats out for most meals; egg, bacon sandwich with little bread; cottage cheese with fruit; oatmeal   Lunch meat and salad   Dinner vegetables   Beverage(s) water, fruit juice, sugar sweetened tea 1-2 x week   Exercise   Exercise Type ADL's   Patient Education   Previous Diabetes Education No   Disease state  Definition of diabetes, type 1 and 2, and the diagnosis of diabetes   Nutrition management  Role of diet in the treatment of diabetes and the relationship between the three main macronutrients and blood glucose level   Physical activity and exercise  Role of exercise on diabetes management, blood pressure control and cardiac health.   Medications Reviewed patients medication for diabetes, action, purpose, timing of dose and side effects.   Monitoring Purpose and frequency of SMBG.;Identified appropriate SMBG and/or A1C goals.   Chronic complications Relationship between chronic complications and blood glucose control   Psychosocial adjustment Identified and addressed patients feelings and concerns about diabetes   Individualized Goals (developed by patient)   Reducing Risk Improve blood sugars Prevent diabetes complications Lose weight Lead a healthier lifestyle Become more fit   Outcomes   Expected Outcomes Demonstrated interest in learning. Expect positive outcomes      Individualized Plan for Diabetes Self-Management Training:   Learning Objective:  Patient will  have a greater understanding of diabetes self-management. Patient education plan is to attend individual and/or group sessions per assessed needs and concerns.   Plan:   Patient Instructions  Check blood sugars 1 x day before breakfast or 2 hrs after supper every day Eat 3 meals day,   1-2  snacks a day Space meals 4-6 hours apart Avoid sugar sweetened drinks (tea, juices) Bring blood sugar records to the next class   Expected  Outcomes:  Demonstrated interest in learning. Expect positive outcomes  Education material provided:  General Meal Planning Guidelines  If problems or questions, patient to contact team via:   Sharion Settler, RN, CCM, CDE [phone]  Future DSME appointment:  April 23, 2015 for Class 1

## 2015-04-07 NOTE — Patient Instructions (Addendum)
Check blood sugars 1 x day before breakfast or 2 hrs after supper every day Eat 3 meals day,   1-2  snacks a day Space meals 4-6 hours apart Avoid sugar sweetened drinks (tea, juices) Bring blood sugar records to the next class

## 2015-04-25 ENCOUNTER — Ambulatory Visit: Payer: Federal, State, Local not specified - PPO

## 2015-04-29 ENCOUNTER — Telehealth: Payer: Self-pay | Admitting: Dietician

## 2015-04-29 NOTE — Telephone Encounter (Signed)
Called patient to reschedule class series which she cancelled for 04/25/15. Unable to leave a message due to full mailbox. Will attempt again later.

## 2015-05-02 ENCOUNTER — Ambulatory Visit: Payer: Federal, State, Local not specified - PPO

## 2015-05-04 ENCOUNTER — Ambulatory Visit: Payer: Federal, State, Local not specified - PPO

## 2015-05-06 ENCOUNTER — Telehealth: Payer: Self-pay | Admitting: Dietician

## 2015-05-06 NOTE — Telephone Encounter (Signed)
Patient called to reschedule missed classes. She will return for class 1 on 05/16/15 and complete that class series.

## 2015-05-09 ENCOUNTER — Ambulatory Visit: Payer: Federal, State, Local not specified - PPO

## 2015-05-16 ENCOUNTER — Encounter: Payer: Federal, State, Local not specified - PPO | Attending: Internal Medicine | Admitting: Dietician

## 2015-05-16 VITALS — Ht 64.0 in | Wt 191.7 lb

## 2015-05-16 DIAGNOSIS — E119 Type 2 diabetes mellitus without complications: Secondary | ICD-10-CM | POA: Insufficient documentation

## 2015-05-16 NOTE — Progress Notes (Signed)

## 2015-05-30 ENCOUNTER — Encounter: Payer: Federal, State, Local not specified - PPO | Attending: Internal Medicine | Admitting: Dietician

## 2015-05-30 ENCOUNTER — Encounter: Payer: Self-pay | Admitting: Dietician

## 2015-05-30 VITALS — Wt 192.8 lb

## 2015-05-30 DIAGNOSIS — E119 Type 2 diabetes mellitus without complications: Secondary | ICD-10-CM | POA: Diagnosis not present

## 2015-05-30 DIAGNOSIS — IMO0001 Reserved for inherently not codable concepts without codable children: Secondary | ICD-10-CM

## 2015-05-30 DIAGNOSIS — E1165 Type 2 diabetes mellitus with hyperglycemia: Secondary | ICD-10-CM

## 2015-05-30 NOTE — Progress Notes (Signed)

## 2015-06-01 ENCOUNTER — Ambulatory Visit
Admission: RE | Admit: 2015-06-01 | Discharge: 2015-06-01 | Disposition: A | Discharge status: Home / Self Care | Payer: Federal, State, Local not specified - PPO | Source: Ambulatory Visit | Attending: Physician Assistant | Admitting: Physician Assistant

## 2015-06-01 DIAGNOSIS — Z1231 Encounter for screening mammogram for malignant neoplasm of breast: Secondary | ICD-10-CM | POA: Diagnosis not present

## 2015-06-06 ENCOUNTER — Encounter: Payer: Federal, State, Local not specified - PPO | Admitting: Dietician

## 2015-06-06 VITALS — BP 130/80 | Ht 64.0 in | Wt 192.4 lb

## 2015-06-06 DIAGNOSIS — E119 Type 2 diabetes mellitus without complications: Secondary | ICD-10-CM | POA: Diagnosis not present

## 2015-06-06 NOTE — Progress Notes (Signed)

## 2015-06-09 ENCOUNTER — Encounter: Payer: Self-pay | Admitting: *Deleted

## 2016-03-23 ENCOUNTER — Other Ambulatory Visit: Payer: Self-pay | Admitting: Surgery

## 2016-03-23 DIAGNOSIS — M75121 Complete rotator cuff tear or rupture of right shoulder, not specified as traumatic: Secondary | ICD-10-CM

## 2016-03-23 DIAGNOSIS — M7581 Other shoulder lesions, right shoulder: Secondary | ICD-10-CM

## 2016-04-09 ENCOUNTER — Ambulatory Visit
Admission: RE | Admit: 2016-04-09 | Discharge: 2016-04-09 | Disposition: A | Discharge status: Home / Self Care | Payer: Federal, State, Local not specified - PPO | Source: Ambulatory Visit | Attending: Surgery | Admitting: Surgery

## 2016-04-09 DIAGNOSIS — M7551 Bursitis of right shoulder: Secondary | ICD-10-CM | POA: Insufficient documentation

## 2016-04-09 DIAGNOSIS — M19011 Primary osteoarthritis, right shoulder: Secondary | ICD-10-CM | POA: Insufficient documentation

## 2016-04-09 DIAGNOSIS — M75121 Complete rotator cuff tear or rupture of right shoulder, not specified as traumatic: Secondary | ICD-10-CM | POA: Insufficient documentation

## 2016-04-09 DIAGNOSIS — M7581 Other shoulder lesions, right shoulder: Secondary | ICD-10-CM

## 2016-05-28 NOTE — Patient Instructions (Signed)
  Your procedure is scheduled on: 06-05-16 (TUESDAY) Report to Same Day Surgery 2nd floor medical mall To find out your arrival time please call [phone] between 1PM - 3PM on 06-04-16 [facility](MONDAY)  Remember: Instructions that are not followed completely may result in serious medical risk, up to and including death, or upon the discretion of your surgeon and anesthesiologist your surgery may need to be rescheduled.    _x___ 1. Do not eat food or drink liquids after midnight. No gum chewing or hard candies.     __x__ 2. No Alcohol for 24 hours before or after surgery.   __x__3. No Smoking for 24 prior to surgery.   ____  4. Bring all medications with you on the day of surgery if instructed.    __x__ 5. Notify your doctor if there is any change in your medical condition     (cold, fever, infections).     Do not wear jewelry, make-up, hairpins, clips or nail polish.  Do not wear lotions, powders, or perfumes. You may wear deodorant.  Do not shave 48 hours prior to surgery. Men may shave face and neck.  Do not bring valuables to the hospital.    [facility] any belongings or valuables.               Contacts, dentures or bridgework may not be worn into surgery.  Leave your suitcase in the car. After surgery it may be brought to your room.  For patients admitted to the hospital, discharge time is determined by your treatment team.   Patients discharged the day of surgery will not be allowed to drive home.    Please read over the following fact sheets that you were given:   [facility] and or MRSA Information   ____ Take these medicines the morning of surgery with A SIP OF WATER:    1. NONE  2.  3.  4.  5.  6.  ____Fleets enema or Magnesium Citrate as directed.   _x___ Use CHG Soap or sage wipes as directed on instruction sheet   ____ Use inhalers on the day of surgery and bring to hospital day of surgery  _X___ Stop metformin 2  days prior to surgery-LAST DOSE ON Saturday, 06-02-16    ____ Take 1/2 of usual insulin dose the night before surgery and none on the morning of surgery.   ____ Stop aspirin or coumadin, or plavix  x__ Stop Anti-inflammatories such as Advil, Aleve, Ibuprofen, Motrin, Naproxen,          Naprosyn, Goodies powders or aspirin products. Ok to take Tylenol.   ____ Stop supplements until after surgery.    ____ Bring C-Pap to the hospital.

## 2016-05-29 ENCOUNTER — Encounter
Admission: RE | Admit: 2016-05-29 | Discharge: 2016-05-29 | Disposition: A | Discharge status: Home / Self Care | Payer: Federal, State, Local not specified - PPO | Source: Ambulatory Visit | Attending: Surgery | Admitting: Surgery

## 2016-05-29 DIAGNOSIS — Z01812 Encounter for preprocedural laboratory examination: Secondary | ICD-10-CM | POA: Diagnosis not present

## 2016-05-29 DIAGNOSIS — E119 Type 2 diabetes mellitus without complications: Secondary | ICD-10-CM | POA: Diagnosis not present

## 2016-05-29 DIAGNOSIS — Z01810 Encounter for preprocedural cardiovascular examination: Secondary | ICD-10-CM | POA: Diagnosis not present

## 2016-05-29 NOTE — Pre-Procedure Instructions (Signed)
Lab called to inform this RN that the pt's green top tube is contaminated and needs to be redrawn.

## 2016-05-30 NOTE — Pre-Procedure Instructions (Signed)
CALLED LAB AND SPOKE WITH JOHN ABOUT CONTAMINATED GREEN TOP- JOHN ASKED IF WE PULLED THIS FROM A LINE AND I INFORMED HIM WE DID NOT- IT WAS ONLY A GREEN TOP THAT WE DREW-  HE STATES THAT POTASSIUM AND CALCIUM ARE EXTREMELY LOW-  CALLED PT AND INFORMED HER THAT HER MET B NEEDS TO BE REDRAWN AND SHE WILL COME IN ON Thursday TO GET THIS DONE

## 2016-05-31 ENCOUNTER — Encounter
Admission: RE | Admit: 2016-05-31 | Discharge: 2016-05-31 | Disposition: A | Discharge status: Home / Self Care | Payer: Federal, State, Local not specified - PPO | Source: Ambulatory Visit | Attending: Surgery | Admitting: Surgery

## 2016-05-31 DIAGNOSIS — Z01812 Encounter for preprocedural laboratory examination: Secondary | ICD-10-CM | POA: Insufficient documentation

## 2016-05-31 DIAGNOSIS — Z01810 Encounter for preprocedural cardiovascular examination: Secondary | ICD-10-CM | POA: Diagnosis not present

## 2016-05-31 DIAGNOSIS — E119 Type 2 diabetes mellitus without complications: Secondary | ICD-10-CM | POA: Insufficient documentation

## 2016-05-31 LAB — BASIC METABOLIC PANEL
ANION GAP: 6 (ref 5–15)
BUN: 21 mg/dL — AB (ref 6–20)
CHLORIDE: 108 mmol/L (ref 101–111)
CO2: 24 mmol/L (ref 22–32)
Calcium: 9.2 mg/dL (ref 8.9–10.3)
Creatinine, Ser: 0.87 mg/dL (ref 0.44–1.00)
GFR calc Af Amer: >60 mL/min (ref >60)
GLUCOSE: 141 mg/dL — AB (ref 65–99)
POTASSIUM: 3.9 mmol/L (ref 3.5–5.1)
Sodium: 138 mmol/L (ref 135–145)

## 2016-06-05 ENCOUNTER — Encounter
Admission: RE | Disposition: A | Discharge status: Home / Self Care | Payer: Self-pay | Source: Ambulatory Visit | Attending: Surgery

## 2016-06-05 ENCOUNTER — Ambulatory Visit
Admission: RE | Admit: 2016-06-05 | Discharge: 2016-06-05 | Disposition: A | Discharge status: Home / Self Care | Payer: Federal, State, Local not specified - PPO | Source: Ambulatory Visit | Attending: Surgery | Admitting: Surgery

## 2016-06-05 ENCOUNTER — Ambulatory Visit: Payer: Federal, State, Local not specified - PPO | Admitting: Anesthesiology

## 2016-06-05 DIAGNOSIS — Z823 Family history of stroke: Secondary | ICD-10-CM | POA: Insufficient documentation

## 2016-06-05 DIAGNOSIS — Z8249 Family history of ischemic heart disease and other diseases of the circulatory system: Secondary | ICD-10-CM | POA: Diagnosis not present

## 2016-06-05 DIAGNOSIS — Z79899 Other long term (current) drug therapy: Secondary | ICD-10-CM | POA: Insufficient documentation

## 2016-06-05 DIAGNOSIS — Z806 Family history of leukemia: Secondary | ICD-10-CM | POA: Insufficient documentation

## 2016-06-05 DIAGNOSIS — Z8619 Personal history of other infectious and parasitic diseases: Secondary | ICD-10-CM | POA: Insufficient documentation

## 2016-06-05 DIAGNOSIS — F329 Major depressive disorder, single episode, unspecified: Secondary | ICD-10-CM | POA: Insufficient documentation

## 2016-06-05 DIAGNOSIS — Z833 Family history of diabetes mellitus: Secondary | ICD-10-CM | POA: Diagnosis not present

## 2016-06-05 DIAGNOSIS — Z791 Long term (current) use of non-steroidal anti-inflammatories (NSAID): Secondary | ICD-10-CM | POA: Insufficient documentation

## 2016-06-05 DIAGNOSIS — E1140 Type 2 diabetes mellitus with diabetic neuropathy, unspecified: Secondary | ICD-10-CM | POA: Insufficient documentation

## 2016-06-05 DIAGNOSIS — M75121 Complete rotator cuff tear or rupture of right shoulder, not specified as traumatic: Secondary | ICD-10-CM | POA: Insufficient documentation

## 2016-06-05 DIAGNOSIS — Z7984 Long term (current) use of oral hypoglycemic drugs: Secondary | ICD-10-CM | POA: Insufficient documentation

## 2016-06-05 DIAGNOSIS — Z9889 Other specified postprocedural states: Secondary | ICD-10-CM | POA: Insufficient documentation

## 2016-06-05 DIAGNOSIS — Z90710 Acquired absence of both cervix and uterus: Secondary | ICD-10-CM | POA: Insufficient documentation

## 2016-06-05 DIAGNOSIS — Z880 Allergy status to penicillin: Secondary | ICD-10-CM | POA: Insufficient documentation

## 2016-06-05 DIAGNOSIS — Z8371 Family history of colonic polyps: Secondary | ICD-10-CM | POA: Insufficient documentation

## 2016-06-05 DIAGNOSIS — M19011 Primary osteoarthritis, right shoulder: Secondary | ICD-10-CM | POA: Diagnosis not present

## 2016-06-05 DIAGNOSIS — K219 Gastro-esophageal reflux disease without esophagitis: Secondary | ICD-10-CM | POA: Diagnosis not present

## 2016-06-05 DIAGNOSIS — M170 Bilateral primary osteoarthritis of knee: Secondary | ICD-10-CM | POA: Insufficient documentation

## 2016-06-05 DIAGNOSIS — M7521 Bicipital tendinitis, right shoulder: Secondary | ICD-10-CM | POA: Insufficient documentation

## 2016-06-05 HISTORY — PX: SHOULDER ARTHROSCOPY WITH SUBACROMIAL DECOMPRESSION AND BICEP TENDON REPAIR: SHX5689

## 2016-06-05 LAB — GLUCOSE, CAPILLARY
GLUCOSE-CAPILLARY: 134 mg/dL — AB (ref 65–99)
Glucose-Capillary: 155 mg/dL — ABNORMAL HIGH (ref 65–99)

## 2016-06-05 SURGERY — SHOULDER ARTHROSCOPY WITH SUBACROMIAL DECOMPRESSION AND BICEP TENDON REPAIR
Anesthesia: General | Laterality: Right

## 2016-06-05 MED ORDER — SODIUM CHLORIDE 0.9 % IV SOLN
INTRAVENOUS | Status: DC
  Administered 2016-06-05: 13:00:00 via INTRAVENOUS

## 2016-06-05 MED ORDER — SODIUM CHLORIDE 0.9 % IV SOLN
INTRAVENOUS | Status: DC | PRN
  Administered 2016-06-05: 20 ug/min via INTRAVENOUS

## 2016-06-05 MED ORDER — SUGAMMADEX SODIUM 200 MG/2ML IV SOLN
INTRAVENOUS | Status: DC | PRN
  Administered 2016-06-05: 200 mg via INTRAVENOUS

## 2016-06-05 MED ORDER — DEXAMETHASONE SODIUM PHOSPHATE 10 MG/ML IJ SOLN
INTRAMUSCULAR | Status: DC | PRN
  Administered 2016-06-05: 5 mg via INTRAVENOUS

## 2016-06-05 MED ORDER — BUPIVACAINE-EPINEPHRINE 0.5% -1:200000 IJ SOLN
INTRAMUSCULAR | Status: DC | PRN
  Administered 2016-06-05: 30 mL

## 2016-06-05 MED ORDER — FENTANYL CITRATE (PF) 100 MCG/2ML IJ SOLN
INTRAMUSCULAR | Status: AC
  Filled 2016-06-05: qty 2

## 2016-06-05 MED ORDER — FENTANYL CITRATE (PF) 100 MCG/2ML IJ SOLN
25.0000 ug | INTRAMUSCULAR | Status: DC | PRN
  Administered 2016-06-05 (×4): 25 ug via INTRAVENOUS

## 2016-06-05 MED ORDER — BUPIVACAINE-EPINEPHRINE (PF) 0.5% -1:200000 IJ SOLN
INTRAMUSCULAR | Status: AC
  Filled 2016-06-05: qty 30

## 2016-06-05 MED ORDER — MIDAZOLAM HCL 2 MG/2ML IJ SOLN
INTRAMUSCULAR | Status: DC | PRN
  Administered 2016-06-05: 2 mg via INTRAVENOUS

## 2016-06-05 MED ORDER — CLINDAMYCIN PHOSPHATE 900 MG/50ML IV SOLN
900.0000 mg | Freq: Once | INTRAVENOUS | Status: AC
  Administered 2016-06-05: 900 mg via INTRAVENOUS

## 2016-06-05 MED ORDER — SUCCINYLCHOLINE CHLORIDE 20 MG/ML IJ SOLN
INTRAMUSCULAR | Status: DC | PRN
  Administered 2016-06-05: 100 mg via INTRAVENOUS

## 2016-06-05 MED ORDER — MIDAZOLAM HCL 5 MG/5ML IJ SOLN
1.0000 mg | Freq: Once | INTRAMUSCULAR | Status: AC
  Administered 2016-06-05: 1 mg via INTRAVENOUS

## 2016-06-05 MED ORDER — ROPIVACAINE HCL 5 MG/ML IJ SOLN
INTRAMUSCULAR | Status: AC
  Filled 2016-06-05: qty 40

## 2016-06-05 MED ORDER — SUGAMMADEX SODIUM 200 MG/2ML IV SOLN: INTRAVENOUS | Status: DC | PRN

## 2016-06-05 MED ORDER — FENTANYL CITRATE (PF) 100 MCG/2ML IJ SOLN
50.0000 ug | Freq: Once | INTRAMUSCULAR | Status: AC
  Administered 2016-06-05: 50 ug via INTRAVENOUS

## 2016-06-05 MED ORDER — LIDOCAINE HCL (PF) 1 % IJ SOLN
INTRAMUSCULAR | Status: DC | PRN
  Administered 2016-06-05: 1 mL via INTRADERMAL

## 2016-06-05 MED ORDER — LIDOCAINE HCL (PF) 1 % IJ SOLN
INTRAMUSCULAR | Status: AC
  Filled 2016-06-05: qty 5

## 2016-06-05 MED ORDER — EPINEPHRINE PF 1 MG/ML IJ SOLN
INTRAMUSCULAR | Status: DC | PRN
  Administered 2016-06-05: 2 mL

## 2016-06-05 MED ORDER — CLINDAMYCIN PHOSPHATE 900 MG/50ML IV SOLN
INTRAVENOUS | Status: AC
  Filled 2016-06-05: qty 50

## 2016-06-05 MED ORDER — OXYCODONE HCL 5 MG PO TABS: 5.0000 mg | ORAL_TABLET | ORAL | 0 refills | Status: AC | PRN

## 2016-06-05 MED ORDER — ONDANSETRON HCL 4 MG/2ML IJ SOLN
INTRAMUSCULAR | Status: DC | PRN
  Administered 2016-06-05: 4 mg via INTRAVENOUS

## 2016-06-05 MED ORDER — FENTANYL CITRATE (PF) 100 MCG/2ML IJ SOLN
INTRAMUSCULAR | Status: DC | PRN
  Administered 2016-06-05: 100 ug via INTRAVENOUS

## 2016-06-05 MED ORDER — OXYCODONE HCL 5 MG PO TABS: 5.0000 mg | ORAL_TABLET | Freq: Once | ORAL | Status: DC | PRN

## 2016-06-05 MED ORDER — EPINEPHRINE PF 1 MG/ML IJ SOLN
INTRAMUSCULAR | Status: AC
  Filled 2016-06-05: qty 2

## 2016-06-05 MED ORDER — FAMOTIDINE 20 MG PO TABS
20.0000 mg | ORAL_TABLET | Freq: Once | ORAL | Status: AC
  Administered 2016-06-05: 20 mg via ORAL

## 2016-06-05 MED ORDER — OXYCODONE HCL 5 MG/5ML PO SOLN
5.0000 mg | Freq: Once | ORAL | Status: DC | PRN
Start: 2016-06-05 — End: 2016-06-05

## 2016-06-05 MED ORDER — MIDAZOLAM HCL 5 MG/5ML IJ SOLN
INTRAMUSCULAR | Status: AC
  Administered 2016-06-05: 1 mg via INTRAVENOUS
  Filled 2016-06-05: qty 5

## 2016-06-05 MED ORDER — PROPOFOL 10 MG/ML IV BOLUS
INTRAVENOUS | Status: DC | PRN
  Administered 2016-06-05: 50 mg via INTRAVENOUS
  Administered 2016-06-05: 150 mg via INTRAVENOUS

## 2016-06-05 MED ORDER — ROPIVACAINE HCL 5 MG/ML IJ SOLN
INTRAMUSCULAR | Status: DC | PRN
  Administered 2016-06-05 (×3): 10 mL via PERINEURAL

## 2016-06-05 MED ORDER — ROCURONIUM BROMIDE 100 MG/10ML IV SOLN
INTRAVENOUS | Status: DC | PRN
  Administered 2016-06-05 (×3): 10 mg via INTRAVENOUS

## 2016-06-05 MED ORDER — FENTANYL CITRATE (PF) 100 MCG/2ML IJ SOLN
INTRAMUSCULAR | Status: AC
  Administered 2016-06-05: 50 ug via INTRAVENOUS
  Filled 2016-06-05: qty 2

## 2016-06-05 MED ORDER — FAMOTIDINE 20 MG PO TABS
ORAL_TABLET | ORAL | Status: AC
  Administered 2016-06-05: 20 mg via ORAL
  Filled 2016-06-05: qty 1

## 2016-06-05 MED ORDER — ONDANSETRON 4 MG PO TBDP: 4.0000 mg | ORAL_TABLET | Freq: Three times a day (TID) | ORAL | 1 refills | Status: AC | PRN

## 2016-06-05 MED ORDER — IBUPROFEN 800 MG PO TABS: 800.0000 mg | ORAL_TABLET | Freq: Three times a day (TID) | ORAL | 2 refills | Status: AC

## 2016-06-05 SURGICAL SUPPLY — 52 items
ANCH SUT 2 2.9 2 LD TPR NDL (Anchor) ×4 IMPLANT
ANCH SUT KNTLS STRL SHLDR SYS (Anchor) ×2 IMPLANT
ANCHOR JUGGERKNOT WTAP NDL 2.9 (Anchor) ×12 IMPLANT
ANCHOR SUT QUATTRO KNTLS 4.5 (Anchor) ×4 IMPLANT
BIT DRILL JUGRKNT W/NDL BIT2.9 (DRILL) ×2 IMPLANT
BLADE FULL RADIUS 3.5 (BLADE) ×3 IMPLANT
BLADE SHAVER 4.5X7 STR FR (MISCELLANEOUS) ×1 IMPLANT
BUR ACROMIONIZER 4.0 (BURR) ×3 IMPLANT
BUR BR 5.5 WIDE MOUTH (BURR) ×1 IMPLANT
CANNULA SHAVER 8MMX76MM (CANNULA) ×3 IMPLANT
CHLORAPREP W/TINT 26ML (MISCELLANEOUS) ×6 IMPLANT
COVER MAYO STAND STRL (DRAPES) ×3 IMPLANT
DRAPE IMP U-DRAPE 54X76 (DRAPES) ×6 IMPLANT
DRILL JUGGERKNOT W/NDL BIT 2.9 (DRILL) ×3
DRSG OPSITE POSTOP 4X8 (GAUZE/BANDAGES/DRESSINGS) ×3 IMPLANT
ELECT REM PT RETURN 9FT ADLT (ELECTROSURGICAL) ×3
ELECTRODE REM PT RTRN 9FT ADLT (ELECTROSURGICAL) ×1 IMPLANT
GAUZE PETRO XEROFOAM 1X8 (MISCELLANEOUS) ×3 IMPLANT
GAUZE SPONGE 4X4 12PLY STRL (GAUZE/BANDAGES/DRESSINGS) ×3 IMPLANT
GLOVE BIO SURGEON STRL SZ7 (GLOVE) ×2 IMPLANT
GLOVE BIO SURGEON STRL SZ7.5 (GLOVE) ×8 IMPLANT
GLOVE BIO SURGEON STRL SZ8 (GLOVE) ×10 IMPLANT
GLOVE BIOGEL PI IND STRL 7.5 (GLOVE) IMPLANT
GLOVE BIOGEL PI IND STRL 8 (GLOVE) ×1 IMPLANT
GLOVE BIOGEL PI INDICATOR 7.5 (GLOVE) ×4
GLOVE BIOGEL PI INDICATOR 8 (GLOVE) ×2
GLOVE INDICATOR 8.0 STRL GRN (GLOVE) ×5 IMPLANT
GOWN STRL REUS W/ TWL LRG LVL3 (GOWN DISPOSABLE) ×2 IMPLANT
GOWN STRL REUS W/ TWL XL LVL3 (GOWN DISPOSABLE) ×1 IMPLANT
GOWN STRL REUS W/TWL LRG LVL3 (GOWN DISPOSABLE) ×9
GOWN STRL REUS W/TWL XL LVL3 (GOWN DISPOSABLE) ×3
GRASPER SUT 15 45D LOW PRO (SUTURE) ×2 IMPLANT
IV LACTATED RINGER IRRG 3000ML (IV SOLUTION) ×6
IV LR IRRIG 3000ML ARTHROMATIC (IV SOLUTION) ×2 IMPLANT
MANIFOLD NEPTUNE II (INSTRUMENTS) ×3 IMPLANT
MASK FACE SPIDER DISP (MASK) ×3 IMPLANT
MAT BLUE FLOOR 46X72 FLO (MISCELLANEOUS) ×3 IMPLANT
NDL REVERSE CUT 1/2 CRC (NEEDLE) ×1 IMPLANT
NEEDLE REVERSE CUT 1/2 CRC (NEEDLE) IMPLANT
PACK ARTHROSCOPY SHOULDER (MISCELLANEOUS) ×3 IMPLANT
SLING ARM LRG DEEP (SOFTGOODS) ×1 IMPLANT
SLING ULTRA II LG (MISCELLANEOUS) ×3 IMPLANT
STAPLER SKIN PROX 35W (STAPLE) ×3 IMPLANT
STRAP SAFETY BODY (MISCELLANEOUS) ×3 IMPLANT
SUT ETHIBOND 0 MO6 C/R (SUTURE) ×3 IMPLANT
SUT VIC AB 2-0 CT1 27 (SUTURE) ×6
SUT VIC AB 2-0 CT1 TAPERPNT 27 (SUTURE) ×2 IMPLANT
TAPE MICROFOAM 4IN (TAPE) ×3 IMPLANT
TUBING ARTHRO INFLOW-ONLY STRL (TUBING) ×3 IMPLANT
TUBING CONNECTING 10 (TUBING) ×2 IMPLANT
TUBING CONNECTING 10' (TUBING) ×1
WAND HAND CNTRL MULTIVAC 90 (MISCELLANEOUS) ×3 IMPLANT

## 2016-06-05 NOTE — Progress Notes (Signed)
No scanning bars on arm band or in chart.

## 2016-06-05 NOTE — Anesthesia Preprocedure Evaluation (Signed)
Anesthesia Evaluation  Patient identified by MRN, date of birth, ID band Patient awake    Reviewed: Allergy & Precautions, H&P , NPO status , Patient's Chart, lab work & pertinent test results  History of Anesthesia Complications Negative for: history of anesthetic complications  Airway Mallampati: III  TM Distance: <3 FB Neck ROM: limited    Dental no notable dental hx. (+) Poor Dentition, Chipped, Missing   Pulmonary shortness of breath and with exertion,    Pulmonary exam normal breath sounds clear to auscultation       Cardiovascular Exercise Tolerance: Good (-) angina(-) Past MI Normal cardiovascular exam Rhythm:regular Rate:Normal     Neuro/Psych negative neurological ROS  negative psych ROS   GI/Hepatic Neg liver ROS, GERD  Controlled,  Endo/Other  diabetes, Type 2  Renal/GU      Musculoskeletal  (+) Arthritis , Fibromyalgia -  Abdominal   Peds  Hematology negative hematology ROS (+)   Anesthesia Other Findings Patient endorses some baseline weakness in operative arm and shoulder at baseline   Past Medical History: No date: Arthritis     Comment: Bilateral knees No date: Chicken pox No date: Chronic sinus infection No date: Cough     Comment: DUE TO POST NASAL DRIP No date: Diabetes mellitus without complication (HCC)     Comment: TYPE II, DIAGNOSIS 2016, ORAL MED No date: GERD (gastroesophageal reflux disease)     Comment: OCCASIONAL-NO MEDS No date: Joint stiffness of knee     Comment: RIGHT KNEE No date: Neuropathy due to type 2 diabetes mellitus (HC* No date: Shortness of breath dyspnea No date: Urine incontinence  Past Surgical History: 2010: CARDIAC CATHETERIZATION     Comment: Dr Cassie FreerParachos , NO ISSUES, ALSO HAD ECHO AND               STRESS TEST 02/03/2015: ENDOSCOPIC CONCHA BULLOSA RESECTION Bilateral     Comment: Procedure:  CONCHA BULLOSA RESECTION;                Surgeon: Vernie MurdersPaul  Juengel, MD;  [facility];  Service: ENT;  Laterality:               Bilateral; No date: EYE SURGERY Left     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 02/03/2015: IMAGE GUIDED SINUS SURGERY Bilateral     Comment: Procedure: IMAGE GUIDED SINUS SURGERY;                Surgeon: Vernie MurdersPaul Juengel, MD;  [facility];  Service: ENT;  Laterality:               Bilateral; 2015: KNEE SURGERY Right     Comment: ARTHROSCOPIC, TORN MENISCUS 02/03/2015: MAXILLARY ANTROSTOMY Bilateral     Comment: Procedure: MAXILLARY ANTROSTOMY;  Surgeon:               Vernie MurdersPaul Juengel, MD;  [facility];  Service: ENT;  Laterality: Bilateral; 02/03/2015: NASAL SEPTOPLASTY W/ TURBINOPLASTY Bilateral     Comment: Procedure: NASAL SEPTOPLASTY WITH TURBINATE               REDUCTION MIDDLE;  Surgeon: Vernie MurdersPaul Juengel, MD;  [facility];  Service: ENT;                Laterality: Bilateral;  GAVE DISK TO CE CE 1982: Rupture disc     Comment: C4 02/03/2015: SPHENOIDECTOMY Left     Comment: Procedure: SPHENOIDECTOMY;  Surgeon: Vernie MurdersPaul               Juengel, MD;  [facility];                Service: ENT;  Laterality: Left; 1975: TONSILLECTOMY AND ADENOIDECTOMY 2001: TOTAL ABDOMINAL HYSTERECTOMY W/ BILATERAL SALP* No date: TUBAL LIGATION 1975: TUBAL LIGATION  BMI    Body Mass Index:  32.61 kg/m      Reproductive/Obstetrics negative OB ROS                             Anesthesia Physical Anesthesia Plan  ASA: III  Anesthesia Plan: General ETT   Post-op Pain Management:  Regional for Post-op pain   Induction:   Airway Management Planned:   Additional Equipment:   Intra-op Plan:   Post-operative Plan:   Informed Consent: I have reviewed the patients History and Physical, chart, labs and discussed the procedure including the risks, benefits  and alternatives for the proposed anesthesia with the patient or authorized representative who has indicated his/her understanding and acceptance.   Dental Advisory Given  Plan Discussed with: Anesthesiologist, CRNA and Surgeon  Anesthesia Plan Comments:         Anesthesia Quick Evaluation

## 2016-06-05 NOTE — Anesthesia Procedure Notes (Signed)
Anesthesia Regional Block:  Interscalene brachial plexus block  Pre-Anesthetic Checklist: ,, timeout performed, Correct Patient, Correct Site, Correct Laterality, Correct Procedure, Correct Position, site marked, Risks and benefits discussed,  Surgical consent,  Pre-op evaluation,  At surgeon's request and post-op pain management  Laterality: Upper and Right  Prep: chloraprep       Needles:  Injection technique: Single-shot  Needle Type: Stimiplex     Needle Length: 5cm 5 cm Needle Gauge: 22 and 22 G    Additional Needles:  Procedures: ultrasound guided (picture in chart) Interscalene brachial plexus block Narrative:  Start time: 06/05/2016 1:48 PM End time: 06/05/2016 1:50 PM Injection made incrementally with aspirations every 5 mL.  Performed by: Personally  Anesthesiologist: Thomasena Vandenheuvel K  Additional Notes: Patient endorses baseline weakness in operative arm and shoulder  Functioning IV was confirmed and monitors were applied.  A 40mChi Memorial Hospital-[phone] Eye Surgery CeEn38miEl Paso Children'S HospitSan6057685951uaDelf7846Norman Regional HealthpleEn66miYale-New Haven HospitSan([phone] Bend Ambulatory Surgical CenterEn40miConcord Endoscopy Center [phone] LP9En67miMcgee Eye Surgery Center [phone]94En16miAccess Hospital [facility], LSan404 [phone] L Mcclell[facility] Memorial Veterans HospiEn41miOur Children'S House At [phone] FlEn66miSouthcoast Hospitals Group - St. Luke'S [id number].En58miMethodist Texsan [phone] CEn23miSt Josephs [phone] Perm[facility] BasEn70miVancouver Eye Care [phone] Kitty HawkEn61miFour Winds Hospital SaratoSan([phone] CEn32miTri Parish Rehabilitation [phone] Partnership8076 SWEn85miTaravista Behavioral Health CentSan([phone] LLC61En12miPeacehealth Cottage Grove Community HospitSan([phone]619 WeEn1miBluffton Okatie Surgery Center LSan231 [phone]3En58miRegency [facility] [place], [phone] LLC85En36miPlano Specialty [phone] Coll Y Toste22 En35miProvidence - Park HospitSan6034961458uaDelf7846Al[facility] Endoscopy Center LLCEn79miTrinity MuscatiSan260 [phone] House Behavioral HealthEn45miBetter Living Endoscopy [phone]24En109miCommunity Hospital Of Huntington [id number] LLC67En60miUs Air Force HoSan([phone] Asc [phone] Health SurgicenterEn56miSjrh - St Johns DivisiSan([phone] IEn33miLos Robles Hospital & Medical CentSan628 [phone] VirEngineer, materialsDr.eedle was used. Sterile prep,hand hygiene and sterile gloves were used.  Negative aspiration and negative test dose prior to incremental administration of local anesthetic. The patient tolerated the procedure well with no immediate complications.

## 2016-06-05 NOTE — Anesthesia Procedure Notes (Addendum)
Procedure Name: Intubation Date/Time: 06/05/2016 3:09 PM Performed by: Lily KocherPERALTA, Agnieszka Newhouse Pre-anesthesia Checklist: Patient identified, Patient being monitored, Timeout performed, Emergency Drugs available and Suction available Patient Re-evaluated:Patient Re-evaluated prior to inductionOxygen Delivery Method: Circle system utilized Preoxygenation: Pre-oxygenation with 100% oxygen Intubation Type: IV induction Ventilation: Mask ventilation without difficulty Laryngoscope Size: Mac and 3 Grade View: Grade I Tube type: Oral Tube size: 7.0 mm Number of attempts: 1 Airway Equipment and Method: Stylet Placement Confirmation: ETT inserted through vocal cords under direct vision,  positive ETCO2 and breath sounds checked- equal and bilateral Secured at: 21 cm Tube secured with: Tape Dental Injury: Teeth and Oropharynx as per pre-operative assessment

## 2016-06-05 NOTE — H&P (Signed)
Paper H&P to be scanned into permanent record. H&P reviewed. No changes. 

## 2016-06-05 NOTE — Transfer of Care (Signed)
Immediate Anesthesia Transfer of Care Note  Patient: Alexandria Silva  Procedure(s) Performed: Procedure(s): SHOULDER ARTHROSCOPY WITH SUBACROMIAL DECOMPRESSION AND BICEP TENDODESIS, DEBRIDEMENT (Right)  Patient Location: PACU  Anesthesia Type:General  Level of Consciousness: sedated  Airway & Oxygen Therapy: Patient Spontanous Breathing and Patient connected to face mask oxygen  Post-op Assessment: Report given to RN and Post -op Vital signs reviewed and stable  Post vital signs: Reviewed and stable  Last Vitals:  Vitals:   06/05/16 1439 06/05/16 1707  BP:  (!) 141/91  Pulse: 80 76  Resp: 16 17  Temp:  37.8C    Last Pain:  Vitals:   06/05/16 1237  TempSrc: Tympanic         Complications: No apparent anesthesia complications

## 2016-06-05 NOTE — Discharge Instructions (Signed)
Keep dressing dry and intact.  May shower after dressing changed on post-op day #4 (Saturday).  Cover staples with Band-Aids after drying off. Apply ice frequently to shoulder. Take ibuprofen 800 mg TID with meals for 7-10 days, then as necessary. Take oxycodone as prescribed when needed.  May supplement with ES Tylenol if necessary. Keep shoulder immobilizer on at all times except may remove for bathing purposes. Follow-up in 10-14 days or as scheduled.

## 2016-06-05 NOTE — Op Note (Signed)
06/05/2016  4:48 PM  Patient:   Alexandria Silva  Pre-Op Diagnosis:   Large full-thickness rotator cuff tear, right shoulder.  Postoperative diagnosis: Large full-thickness rotator cuff tear with labral fraying, degenerative joint disease, and biceps tendinopathy, right shoulder.  Procedure: Extensive arthroscopic debridement, arthroscopic subacromial decompression, mini-open rotator cuff repair, and mini-open biceps tenodesis, right shoulder.  Anesthesia: General endotracheal with interscalene block placed preoperatively by the anesthesiologist.  Surgeon:   Maryagnes AmosJ. Jeffrey Capers Hagmann, MD  Assistant:   Horris LatinoLance McGhee, PA-C  Findings: As above. There were diffuse grade 2 chondromalacial changes involving the central portion of the glenoid and grade 2-3 chondromalacial changes involving much of the humeral articular surface. There was extensive labral fraying anteriorly and superiorly, as well as extensive synovitis. The labral attachment to the glenoid was maintained. There was a partial-thickness tear of the articular surface of the subscapularis tendon, although the integrity of the tendon and its attachment was maintained.  Complications: None  Fluids:   900 cc  Estimated blood loss: 10 cc  Tourniquet time: None  Drains: None  Closure: Staples   Brief clinical note: The patient is a 65 year old female with a 7 month history of right shoulder pain following an injury lifting her suitcase. The patient's symptoms have progressed despite medications, activity modification, etc. The patient's history and examination are consistent with impingement/tendinopathy with a rotator cuff tear. These findings were confirmed by MRI scan. The patient presents at this time for definitive management of these shoulder symptoms.  Procedure: The patient nderwent placement of an interscalene block by the anesthesiologist in the preoperative holding area before she was brought into the  operating room and lain in the supine position. The patient then underwent general endotracheal intubation and anesthesia before being repositioned in the beach chair position using the beach chair positioner. The right shoulder and upper extremity were prepped with ChloraPrep solution before being draped sterilely. Preoperative antibiotics were administered. A timeout was performed to confirm the proper surgical site before the expected portal sites and incision site were injected with 0.5% Sensorcaine with epinephrine. A posterior portal was created and the glenohumeral joint thoroughly inspected with the findings as described above. An anterior portal was created using an outside-in technique. The labrum and rotator cuff were further probed, again confirming the above-noted findings. The areas of labral fraying were debrided back to stable margins using the full-radius resector as were the areas of synovitis, areas of loose articular cartilage, and the articular surface of the subscapularis tendon. The ArthroCare wand was inserted and used to obtain hemostasis as well as to "anneal" the labrum superiorly and anteriorly. The instruments were removed from the joint after suctioning the excess fluid.  The camera was repositioned through the posterior portal into the subacromial space. A separate lateral portal was created using an outside-in technique. The 3.5 mm full-radius resector was introduced and used to perform a subtotal bursectomy. The ArthroCare wand was then inserted and used to remove the periosteal tissue off the undersurface of the anterior third of the acromion as well as to recess the coracoacromial ligament from its attachment along the anterior and lateral margins of the acromion. The 4.0 mm acromionizing bur was introduced and used to complete the decompression by removing the undersurface of the anterior third of the acromion. The full radius resector was reintroduced to remove any residual  bony debris before the ArthroCare wand was reintroduced to obtain hemostasis. The instruments were then removed from the subacromial space after suctioning the excess  fluid.  An approximately 4-5 cm incision was made over the anterolateral aspect of the shoulder beginning at the anterolateral corner of the acromion and extending distally in line with the bicipital groove. This incision was carried down through the subcutaneous tissues to expose the deltoid fascia. The raphae between the anterior and middle thirds was identified and this plane developed to provide access into the subacromial space. Additional bursal tissues were debrided sharply using Metzenbaum scissors. The rotator cuff tear was readily identified. The margins were debrided sharply with a #15 blade and the exposed greater tuberosity roughened with a rongeur. The tear was repaired using three Biomet 2.9 mm JuggerKnot anchors. These sutures were then brought back laterally and secured using two [place] QuatroLink anchors to create a two-layer closure. Several #0 Ethibond interrupted sutures were placed in a side-to-side fashion to close the longitudinal portion of the tear. An apparent watertight closure was obtained.  The bicipital groove was identified by palpation and opened for 1-1.5 cm. The biceps tendon stump was retrieved through this defect. The floor of the bicipital groove was roughened with a curet before another Biomet 2.9 mm JuggerKnot anchor was inserted. Both sets of sutures were passed through the biceps tendon and tied securely to effect the tenodesis. The bicipital sheath was reapproximated using two #0 Ethibond interrupted sutures, incorporating the biceps tendon to further reinforce the tenodesis.  The wound was copiously irrigated with sterile saline solution before the deltoid raphae was reapproximated using 2-0 Vicryl interrupted sutures. The subcutaneous tissues were closed in two layers using 2-0 Vicryl interrupted  sutures before the skin was closed using staples. The portal sites also were closed using staples. A sterile bulky dressing was applied to the shoulder before the arm was placed into a shoulder immobilizer. The patient was then awakened, extubated, and returned to the recovery room in satisfactory condition after tolerating the procedure well.

## 2016-06-06 ENCOUNTER — Encounter: Payer: Self-pay | Admitting: Surgery

## 2016-06-07 NOTE — Anesthesia Postprocedure Evaluation (Signed)
Anesthesia Post Note  Patient: Alexandria Silva  Procedure(s) Performed: Procedure(s) (LRB): SHOULDER ARTHROSCOPY WITH SUBACROMIAL DECOMPRESSION AND BICEP TENDODESIS, DEBRIDEMENT (Right)  Patient location during evaluation: PACU Anesthesia Type: General Level of consciousness: awake and alert Pain management: pain level controlled Vital Signs Assessment: post-procedure vital signs reviewed and stable Respiratory status: spontaneous breathing, nonlabored ventilation, respiratory function stable and patient connected to nasal cannula oxygen Cardiovascular status: blood pressure returned to baseline and stable Postop Assessment: no signs of nausea or vomiting Anesthetic complications: no    Last Vitals:  Vitals:   06/05/16 1808 06/05/16 1812  BP: 130/74 133/73  Pulse: 71 69  Resp:    Temp:      Last Pain:  Vitals:   06/06/16 0824  TempSrc:   PainSc: 8                  Cleda MccreedyJoseph K Piscitello

## 2016-07-17 ENCOUNTER — Telehealth: Payer: Self-pay | Admitting: Otolaryngology

## 2016-07-17 NOTE — Telephone Encounter (Signed)
Delayed note from 07/15/16 - Patient called call line to complain of "typical sinus infection" marked by nasal congestion and thick nasal drainage both anteriorly and posteriorly. The posterior drainage is causing her to cough. She says this happens several times annually and her ENT prescribes Biaxin and Tussinex and this usually helps. She denies chest pain, tightness and shortness of breath. No fevers.  She denies taking any narcotic pain medication at this time. Ideally, she would be examined prior to treatment, however, with the holiday and her insistence that her current symptoms are typical for her, joint decision to proceed with Biaxin x 2 weeks for acute sinusitis and Cheratussin as needed for cough. Rx placed by telephone. She agreed to seek medical care if her symptoms worsen or change from "typical".

## 2016-08-10 ENCOUNTER — Other Ambulatory Visit: Payer: Self-pay | Admitting: Physician Assistant

## 2016-08-10 DIAGNOSIS — Z1231 Encounter for screening mammogram for malignant neoplasm of breast: Secondary | ICD-10-CM

## 2016-09-07 ENCOUNTER — Ambulatory Visit
Admission: RE | Admit: 2016-09-07 | Discharge: 2016-09-07 | Disposition: A | Discharge status: Home / Self Care | Payer: Federal, State, Local not specified - PPO | Source: Ambulatory Visit | Attending: Physician Assistant | Admitting: Physician Assistant

## 2016-09-07 DIAGNOSIS — Z1231 Encounter for screening mammogram for malignant neoplasm of breast: Secondary | ICD-10-CM | POA: Diagnosis not present

## 2017-12-26 ENCOUNTER — Other Ambulatory Visit: Payer: Self-pay | Admitting: Physician Assistant

## 2017-12-26 DIAGNOSIS — Z1231 Encounter for screening mammogram for malignant neoplasm of breast: Secondary | ICD-10-CM

## 2018-01-22 ENCOUNTER — Ambulatory Visit
Admission: RE | Admit: 2018-01-22 | Discharge: 2018-01-22 | Disposition: A | Discharge status: Home / Self Care | Payer: Medicare Other | Source: Ambulatory Visit | Attending: Physician Assistant | Admitting: Physician Assistant

## 2018-01-22 DIAGNOSIS — Z1231 Encounter for screening mammogram for malignant neoplasm of breast: Secondary | ICD-10-CM | POA: Diagnosis not present

## 2018-07-10 ENCOUNTER — Ambulatory Visit: Payer: Federal, State, Local not specified - PPO | Admitting: Dietician

## 2018-08-12 ENCOUNTER — Ambulatory Visit: Payer: Federal, State, Local not specified - PPO | Admitting: Dietician

## 2018-09-16 ENCOUNTER — Encounter: Payer: Self-pay | Admitting: Dietician

## 2018-09-16 ENCOUNTER — Encounter: Payer: Federal, State, Local not specified - PPO | Attending: Physician Assistant | Admitting: Dietician

## 2018-09-16 DIAGNOSIS — E119 Type 2 diabetes mellitus without complications: Secondary | ICD-10-CM

## 2018-09-16 NOTE — Progress Notes (Signed)
Medical Nutrition Therapy: Visit start time: 1330 end time: 1430 Assessment:  Diagnosis: Type 2 Diabetes Past medical history: hypertension, hyperlipidemia, fibromyalgia Psychosocial issues/ stress concerns:  Patient rates her stress as "moderate" and indicates "ok" as to how well she is dealing with her stress.  Preferred learning method:  . Auditory . Visual  Current weight: 203.9 lbs  Height: 64 in Medications, supplements: see list  Progress and evaluation:  Patient in for initial medical nutrition therapy appointment. She reports that her last HgA1c  was 7.7 and she has been told she must have an A1c of 7 for 1 year before knee surgery can be done. Her knee pain limits her mobility which she states has contributed to her weight gain of 10 lbs in the past year.  She reports her FBG's are in the 140's presently. 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 She states she drives 50 miles into [place] for her and family member's  appointments which effects her meal times. She does limited food preparation and dines out or take out 10 meals weekly. She eats 2 meals and several snacks daily. She states that carbs, both starchy snacks and sweets are her weakness.  Physical activity:Stretches and step climbing 3 days/week for 20 minutes.  Dietary Intake:  Usual eating pattern includes 2 meals and 2-3 snacks per day. Dining out frequency:10 meals per week.  Breakfast: 7:00am- fruit or small bowl cereal                   9:00am- 2 eggs, 1 slice toast,  Bacon or Canandian bacon, coffee, water Lunch: 2:00pm- Eats a meat and 2 vegetables at cafeteria and takes half home for the next day. 5:00- popcorn 6:00pm- peanut butter and crackers Beverages: coffee, water-5-7 cups daily  Nutrition Care Education: Diabetes:   Instructed on a meal plan for diabetes including identifying carbohydrate foods, protein foods and non-starchy vegetables, portion control and how  to better balance carbohydrate, protein and non-starchy vegetables. Used food guide plate and food models to teach. Also, gave and reviewed "Quick and Healthy Meals". She stated, "This is very helpful". Discussed how in preparing more meals at home, she could control portions, sodium and type of fat added. Encouraged to establish a meal pattern of 3 small meals and only 1 small snack in the evening verses snacking in place of a meal. Also, discussed making healthier choices when dining out.  Nutritional Diagnosis:  NI-1.5 Excessive energy intake As related to frequent dining out, 10 meals weekly and limited mobility.  As evidenced by diet history..  Intervention:  Try to establish a consistent pattern of 3 meals spaced 4-5 hours apart. Based on your schedule, 9:00am, 2:00pm and 6:00pm would space meals out nicely. Would still be ok to eat a small snack at 7:00am with 1 carbohydrate serving (starch)  and 1oz protein. Use food guide plate to be mindful of including a protein source 1-3 oz, 1-2 starch servings and a non-starchy vegetable. Can add a small serving of fruit at any meal. Eat the sliced mushrooms that you like at any time. Refer to Quick and Healthy Meal for easy meal ideas. Increase exercise to 5 days per week- same that she is presently doing 3 days weekly.  Education Materials given:  . Plate Planner . Food lists/ Planning A Balanced Meal . Sample meal pattern/ menus . Quick and Healthy Meals . Goals/ instructions  Learner/ who was taught:  . Patient  Level of understanding: . Partial understanding; needs review/ practice  Demonstrated degree of understanding via:   Teach back Learning barriers: . None  Willingness to learn/ readiness for change: . Acceptance, ready for change  Monitoring and Evaluation:   Patient stated she would call to schedule her follow-up appointment once she can check her calendar which is presently "full of appointments".

## 2018-09-16 NOTE — Patient Instructions (Addendum)
Try to establish a consistent pattern of 3 meals spaced 4-5 hours apart. Based on your schedule, 9:00am, 2:00pm and 6:00pm would space meals out nicely. Would still be ok to eat a small snack at 7:00am with 1 carbohydrate serving (starch)  and 1oz protein. Use food guide plate to be mindful of including a protein source 1-3 oz, 1-2 starch servings and a non-starchy vegetable. Can add a small serving of fruit at any meal. Eat the sliced mushrooms that you like at any time. Refer to Quick and Healthy Meal for easy meal ideas. Increase exercise to 5 days per week- same that she is presently doing 3 days weekly.

## 2019-10-26 ENCOUNTER — Ambulatory Visit: Payer: Federal, State, Local not specified - PPO

## 2020-11-10 ENCOUNTER — Other Ambulatory Visit: Payer: Self-pay | Admitting: Physician Assistant

## 2020-11-10 DIAGNOSIS — Z1231 Encounter for screening mammogram for malignant neoplasm of breast: Secondary | ICD-10-CM

## 2020-11-30 ENCOUNTER — Ambulatory Visit
Admission: RE | Admit: 2020-11-30 | Discharge: 2020-11-30 | Disposition: A | Discharge status: Home / Self Care | Payer: Federal, State, Local not specified - PPO | Source: Ambulatory Visit | Attending: Physician Assistant | Admitting: Physician Assistant

## 2020-11-30 ENCOUNTER — Other Ambulatory Visit: Payer: Self-pay

## 2020-11-30 DIAGNOSIS — Z1231 Encounter for screening mammogram for malignant neoplasm of breast: Secondary | ICD-10-CM

## 2022-06-08 ENCOUNTER — Other Ambulatory Visit: Payer: Self-pay | Admitting: Physician Assistant

## 2022-06-08 DIAGNOSIS — E119 Type 2 diabetes mellitus without complications: Secondary | ICD-10-CM

## 2022-06-08 DIAGNOSIS — E785 Hyperlipidemia, unspecified: Secondary | ICD-10-CM

## 2022-06-08 DIAGNOSIS — R9431 Abnormal electrocardiogram [ECG] [EKG]: Secondary | ICD-10-CM

## 2022-06-08 DIAGNOSIS — I10 Essential (primary) hypertension: Secondary | ICD-10-CM

## 2022-06-08 DIAGNOSIS — Z8249 Family history of ischemic heart disease and other diseases of the circulatory system: Secondary | ICD-10-CM

## 2022-06-27 ENCOUNTER — Telehealth (HOSPITAL_COMMUNITY): Payer: Self-pay | Admitting: *Deleted

## 2022-06-27 NOTE — Telephone Encounter (Signed)
Attempted to call patient regarding upcoming cardiac CT appointment. °Left message on voicemail with name and callback number ° °Audris Speaker RN Navigator Cardiac Imaging °[facility] °[phone] Office °[phone] Cell ° °

## 2022-06-27 NOTE — Telephone Encounter (Signed)
Patient wishing to reschedule due to having a sinus infection.  New appointment made for December 21 at 10;15am.  Larey Brick RN Navigator Cardiac Imaging [facility] [phone] Office [phone] Cell

## 2022-06-28 ENCOUNTER — Ambulatory Visit: Admission: RE | Admit: 2022-06-28 | Payer: Federal, State, Local not specified - PPO | Source: Ambulatory Visit

## 2022-07-10 ENCOUNTER — Telehealth (HOSPITAL_COMMUNITY): Payer: Self-pay | Admitting: *Deleted

## 2022-07-10 ENCOUNTER — Other Ambulatory Visit (HOSPITAL_COMMUNITY): Payer: Self-pay | Admitting: *Deleted

## 2022-07-10 MED ORDER — METOPROLOL TARTRATE 100 MG PO TABS: ORAL_TABLET | ORAL | 0 refills | Status: AC

## 2022-07-10 NOTE — Telephone Encounter (Signed)
Reaching out to patient to offer assistance regarding upcoming cardiac imaging study; pt verbalizes understanding of appt date/time, parking situation and where to check in, pre-test NPO status and medications ordered, and verified current allergies; name and call back number provided for further questions should they arise  Larey Brick RN Navigator Cardiac Imaging [facility] [phone] office [phone] cell  Patient reports her HR since starting cardizem is 90 with a BP of 130/84. She states she had not time to calm down before that reading was taken.  Patient to take 100mg  metoprolol tartrate two hours prior to her cardiac CT scan.  She is aware to arrive at 10am.

## 2022-07-12 ENCOUNTER — Ambulatory Visit
Admission: RE | Admit: 2022-07-12 | Discharge: 2022-07-12 | Disposition: A | Discharge status: Home / Self Care | Payer: Medicare Other | Source: Ambulatory Visit | Attending: Physician Assistant | Admitting: Physician Assistant

## 2022-07-12 DIAGNOSIS — E119 Type 2 diabetes mellitus without complications: Secondary | ICD-10-CM | POA: Diagnosis not present

## 2022-07-12 DIAGNOSIS — R9431 Abnormal electrocardiogram [ECG] [EKG]: Secondary | ICD-10-CM | POA: Insufficient documentation

## 2022-07-12 DIAGNOSIS — E785 Hyperlipidemia, unspecified: Secondary | ICD-10-CM | POA: Insufficient documentation

## 2022-07-12 DIAGNOSIS — I10 Essential (primary) hypertension: Secondary | ICD-10-CM | POA: Diagnosis present

## 2022-07-12 DIAGNOSIS — Z8249 Family history of ischemic heart disease and other diseases of the circulatory system: Secondary | ICD-10-CM | POA: Diagnosis present

## 2022-07-12 MED ORDER — NITROGLYCERIN 0.4 MG SL SUBL
0.8000 mg | SUBLINGUAL_TABLET | Freq: Once | SUBLINGUAL | Status: AC
  Administered 2022-07-12: 0.8 mg via SUBLINGUAL

## 2022-07-12 MED ORDER — IOHEXOL 350 MG/ML SOLN
75.0000 mL | Freq: Once | INTRAVENOUS | Status: AC | PRN
  Administered 2022-07-12: 75 mL via INTRAVENOUS

## 2022-07-12 NOTE — Progress Notes (Signed)
Patient tolerated procedure well. Ambulate w/o difficulty. Denies any lightheadedness or being dizzy. Pt denies any pain at this time. Sitting in chair, drinking water provided. P is encouraged to drink additional water throughout the day and reason explained to patient. Patient verbalized understanding and all questions answered. ABC intact. No further needs at this time. Discharge from procedure area w/o issues.  °

## 2022-10-16 IMAGING — MG MM DIGITAL SCREENING BILAT W/ TOMO AND CAD
8 series · 8 of 24 positions shown · non-contrast
Comparison: Previous exam(s).

CLINICAL DATA: Screening.

EXAM:
DIGITAL SCREENING BILATERAL MAMMOGRAM WITH TOMOSYNTHESIS AND CAD
TECHNIQUE: Bilateral screening digital craniocaudal and mediolateral oblique
mammograms were obtained. Bilateral screening digital breast
tomosynthesis was performed. The images were evaluated with
computer-aided detection.

[R CC synth-2D]
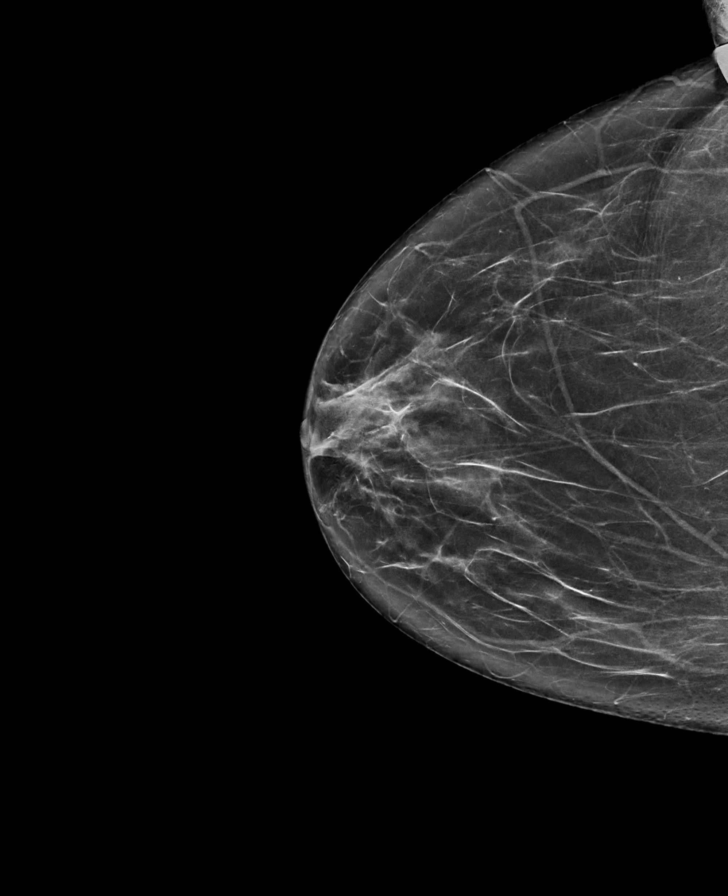

[L CC synth-2D]
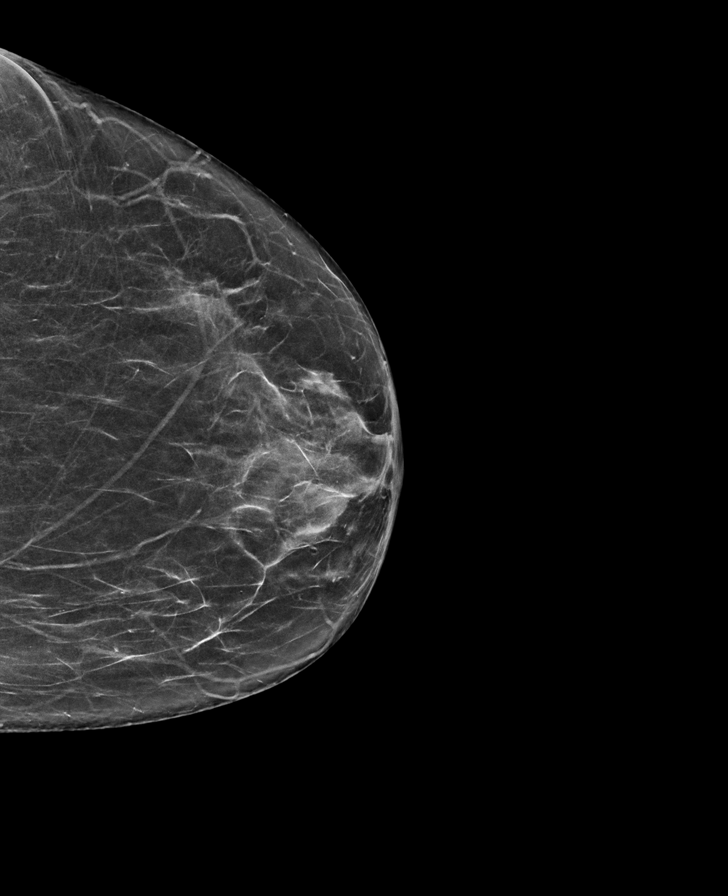

[L MLO synth-2D]
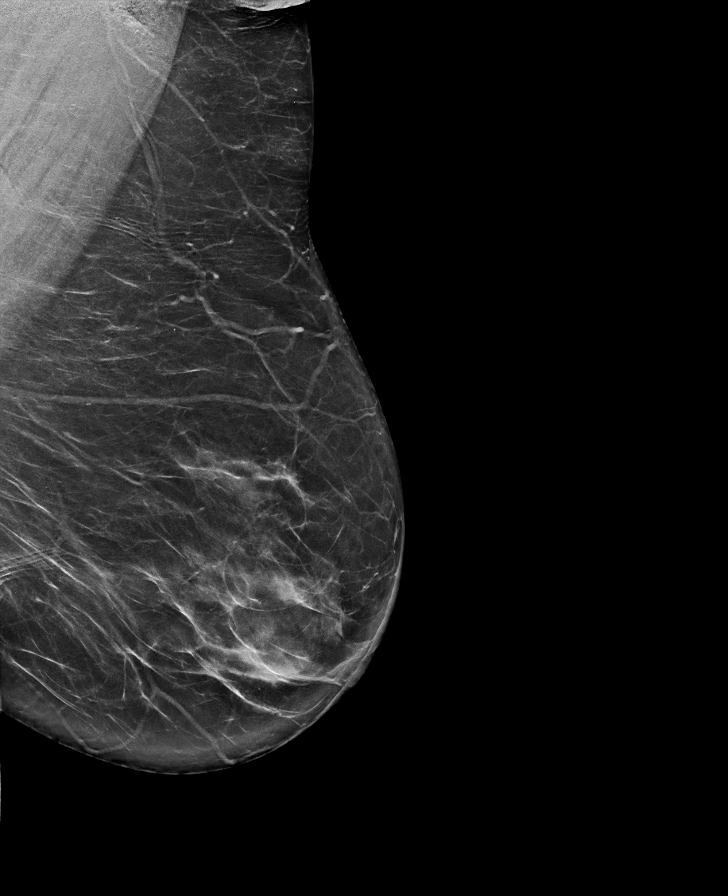

[R MLO synth-2D]
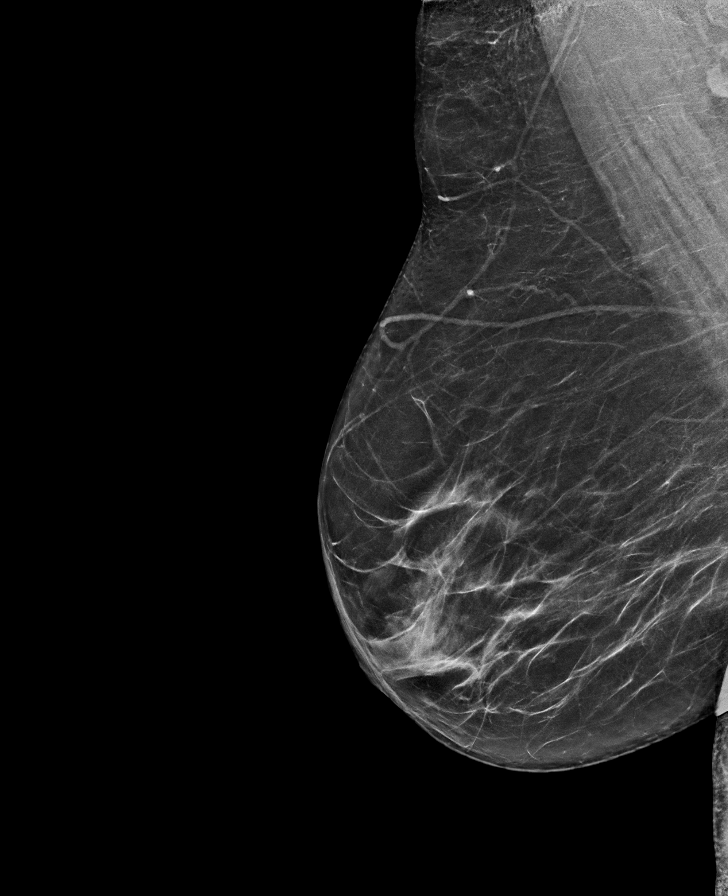

[R CC tomo · tomo slice 35/68.0]
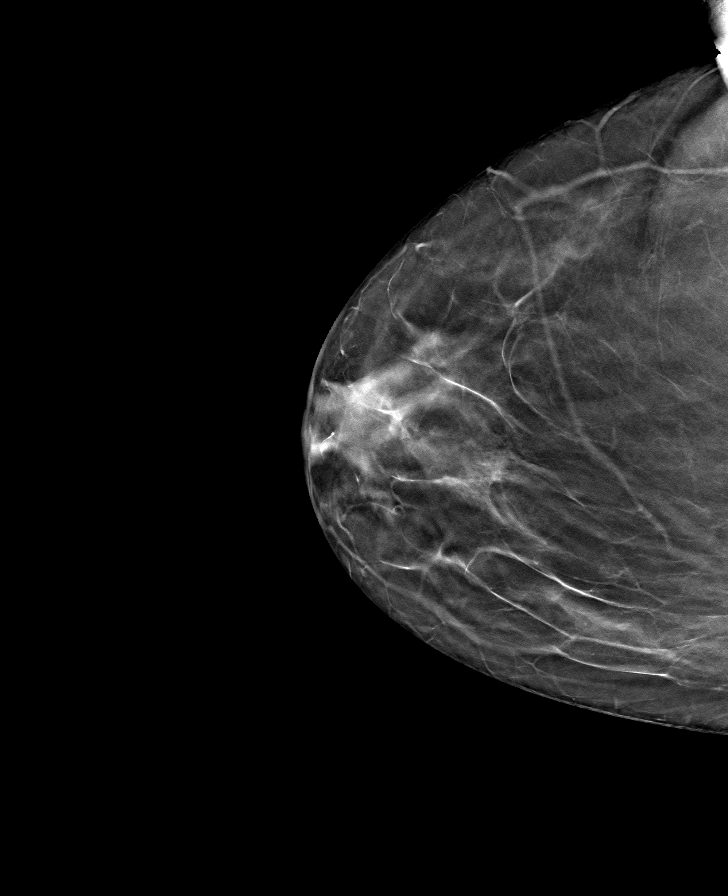

[L CC tomo · tomo slice 30/59.0]
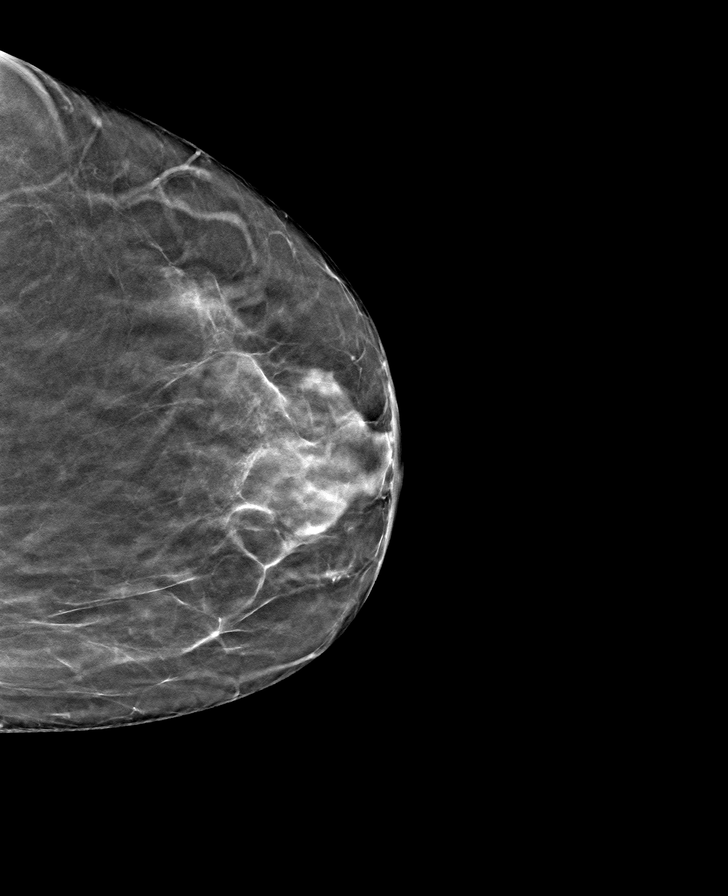

[L MLO tomo · tomo slice 39/78.0]
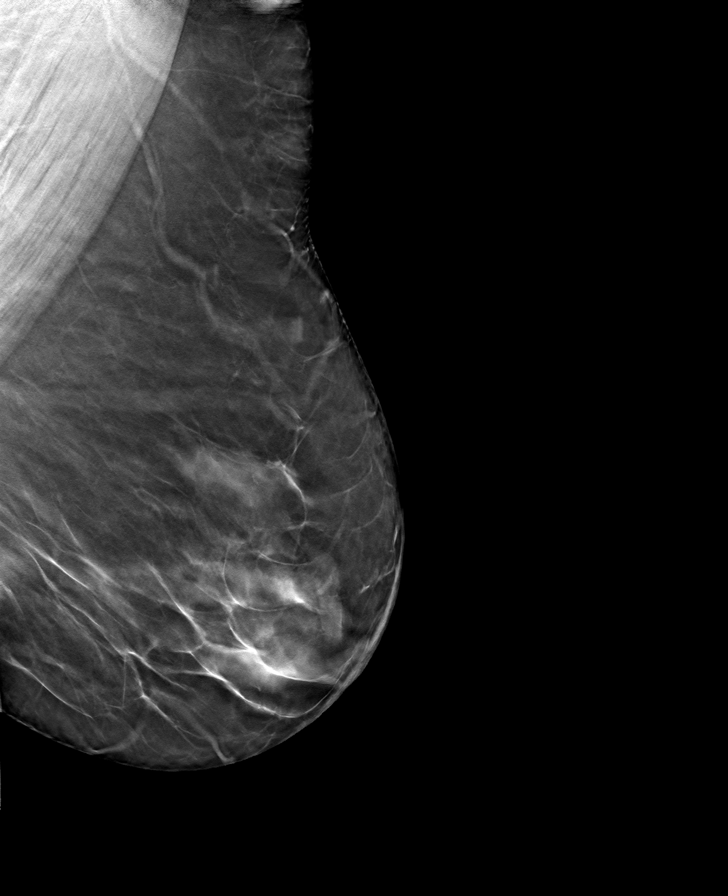

[R MLO tomo · tomo slice 37/72.0]
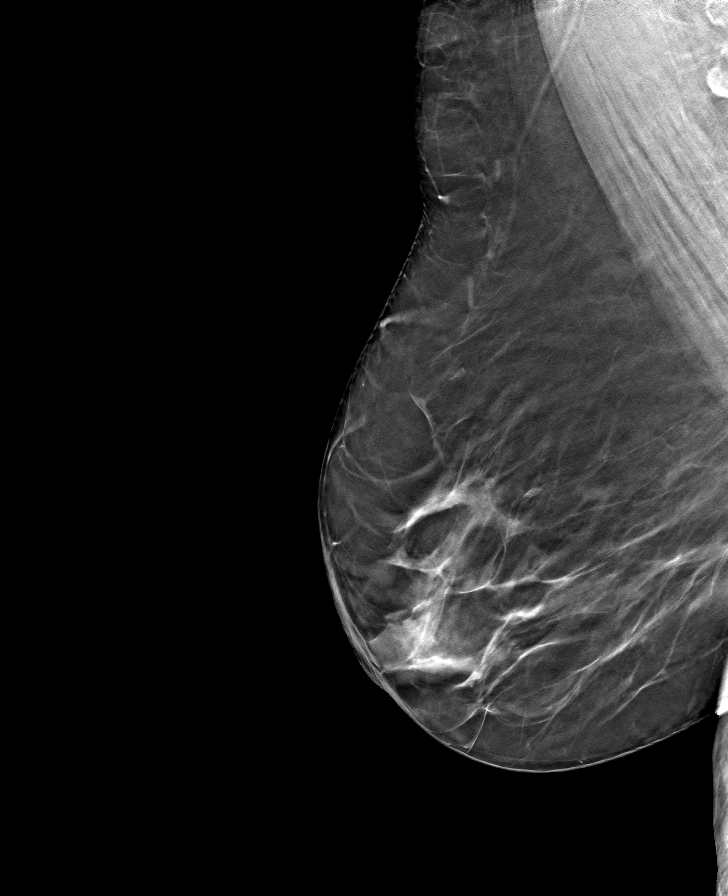

[8 of 24 positions shown; findings below may reference images not displayed]

ACR Breast Density Category b: There are scattered areas of
fibroglandular density.
FINDINGS: There are no findings suspicious for malignancy. The images were
evaluated with computer-aided detection.
IMPRESSION: No mammographic evidence of malignancy. A result letter of this
screening mammogram will be mailed directly to the patient.

RECOMMENDATION:
Screening mammogram in one year. (Code:WJ-[id number])

BI-RADS CATEGORY  1: Negative.

## 2023-03-05 ENCOUNTER — Other Ambulatory Visit: Payer: Self-pay | Admitting: Physician Assistant

## 2023-03-05 DIAGNOSIS — Z1231 Encounter for screening mammogram for malignant neoplasm of breast: Secondary | ICD-10-CM
